# Patient Record
Sex: Female | Born: 1955
Health system: Southern US, Community
[De-identification: ages and names within clinical notes are randomized; demographics above are authoritative.]

## PROBLEM LIST (undated history)

## (undated) DIAGNOSIS — I10 Essential (primary) hypertension: Secondary | ICD-10-CM

## (undated) DIAGNOSIS — E785 Hyperlipidemia, unspecified: Secondary | ICD-10-CM

---

## 2013-06-02 ENCOUNTER — Ambulatory Visit (INDEPENDENT_AMBULATORY_CARE_PROVIDER_SITE_OTHER): Payer: PRIVATE HEALTH INSURANCE | Admitting: Family Medicine

## 2013-06-02 VITALS — BP 132/78 | HR 73 | Temp 98.3°F | Resp 16 | Ht 68.3 in | Wt 147.8 lb

## 2013-06-02 DIAGNOSIS — R21 Rash and other nonspecific skin eruption: Secondary | ICD-10-CM

## 2013-06-02 LAB — POCT CBC
Granulocyte percent: 70.6 %G (ref 37–80)
HCT, POC: 41.9 % (ref 37.7–47.9)
Hemoglobin: 13.3 g/dL (ref 12.2–16.2)
Lymph, poc: 2.5 (ref 0.6–3.4)
MCH, POC: 31.4 pg — AB (ref 27–31.2)
MCHC: 31.7 g/dL — AB (ref 31.8–35.4)
MCV: 99 fL — AB (ref 80–97)
MID (cbc): 0.8 (ref 0–0.9)
MPV: 8.7 fL (ref 0–99.8)
POC Granulocyte: 8 — AB (ref 2–6.9)
POC LYMPH PERCENT: 22.4 %L (ref 10–50)
POC MID %: 7 %M (ref 0–12)
Platelet Count, POC: 256 10*3/uL (ref 142–424)
RBC: 4.23 M/uL (ref 4.04–5.48)
RDW, POC: 13.2 %
WBC: 11.3 10*3/uL — AB (ref 4.6–10.2)

## 2013-06-02 MED ORDER — PREDNISONE 20 MG PO TABS: ORAL_TABLET | ORAL | Status: DC

## 2013-06-02 NOTE — Patient Instructions (Addendum)
Poison Ivy Poison ivy is a inflammation of the skin (contact dermatitis) caused by touching the allergens on the leaves of the ivy plant following previous exposure to the plant. The rash usually appears 48 hours after exposure. The rash is usually bumps (papules) or blisters (vesicles) in a linear pattern. Depending on your own sensitivity, the rash may simply cause redness and itching, or it may also progress to blisters which may break open. These must be well cared for to prevent secondary bacterial (germ) infection, followed by scarring. Keep any open areas dry, clean, dressed, and covered with an antibacterial ointment if needed. The eyes may also get puffy. The puffiness is worst in the morning and gets better as the day progresses. This dermatitis usually heals without scarring, within 2 to 3 weeks without treatment. HOME CARE INSTRUCTIONS  Thoroughly wash with soap and water as soon as you have been exposed to poison ivy. You have about one half hour to remove the plant resin before it will cause the rash. This washing will destroy the oil or antigen on the skin that is causing, or will cause, the rash. Be sure to wash under your fingernails as any plant resin there will continue to spread the rash. Do not rub skin vigorously when washing affected area. Poison ivy cannot spread if no oil from the plant remains on your body. A rash that has progressed to weeping sores will not spread the rash unless you have not washed thoroughly. It is also important to wash any clothes you have been wearing as these may carry active allergens. The rash will return if you wear the unwashed clothing, even several days later. Avoidance of the plant in the future is the best measure. Poison ivy plant can be recognized by the number of leaves. Generally, poison ivy has three leaves with flowering branches on a single stem. Diphenhydramine may be purchased over the counter and used as needed for itching. Do not drive with  this medication if it makes you drowsy.Ask your caregiver about medication for children. SEEK MEDICAL CARE IF:  Open sores develop.  Redness spreads beyond area of rash.  You notice purulent (pus-like) discharge.  You have increased pain.  Other signs of infection develop (such as fever). Document Released: 11/11/2000 Document Revised: 02/06/2012 Document Reviewed: 09/30/2009 ExitCare Patient Information 2014 [employer].  

## 2013-06-02 NOTE — Progress Notes (Signed)
57 yo woman from [place] who works as Psychologist, forensic.  She was recently treated with antibiotics for cellulitis on both arms, worse on right.  Also was given steroid for poison ivy in [place].  Objective:  NAD Diffuse erythematous blotches up to end of short sleeves both arms. Spares the palms.  No swelling or interdigital rash of hands. Results for orders placed in visit on 06/02/13  POCT CBC      Result Value Range   WBC 11.3 (*) 4.6 - 10.2 K/uL   Lymph, poc 2.5  0.6 - 3.4   POC LYMPH PERCENT 22.4  10 - 50 %L   MID (cbc) 0.8  0 - 0.9   POC MID % 7.0  0 - 12 %M   POC Granulocyte 8.0 (*) 2 - 6.9   Granulocyte percent 70.6  37 - 80 %G   RBC 4.23  4.04 - 5.48 M/uL   Hemoglobin 13.3  12.2 - 16.2 g/dL   HCT, POC 40.9  81.1 - 47.9 %   MCV 99.0 (*) 80 - 97 fL   MCH, POC 31.4 (*) 27 - 31.2 pg   MCHC 31.7 (*) 31.8 - 35.4 g/dL   RDW, POC 91.4     Platelet Count, POC 256  142 - 424 K/uL   MPV 8.7  0 - 99.8 fL     Assessment:  Residual poison ivy.  Suspect the mild WBC elevation is secondary to steroid induced demargination

## 2018-01-16 ENCOUNTER — Other Ambulatory Visit: Payer: Self-pay | Admitting: Internal Medicine

## 2018-01-16 DIAGNOSIS — Z1231 Encounter for screening mammogram for malignant neoplasm of breast: Secondary | ICD-10-CM

## 2018-02-16 ENCOUNTER — Ambulatory Visit
Admission: RE | Admit: 2018-02-16 | Discharge: 2018-02-16 | Disposition: A | Discharge status: Home / Self Care | Payer: No Typology Code available for payment source | Source: Ambulatory Visit | Attending: Internal Medicine | Admitting: Internal Medicine

## 2018-02-16 DIAGNOSIS — Z1231 Encounter for screening mammogram for malignant neoplasm of breast: Secondary | ICD-10-CM

## 2019-03-28 ENCOUNTER — Other Ambulatory Visit: Payer: Self-pay | Admitting: Family Medicine

## 2019-03-28 DIAGNOSIS — Z1231 Encounter for screening mammogram for malignant neoplasm of breast: Secondary | ICD-10-CM

## 2019-05-23 ENCOUNTER — Telehealth: Payer: Self-pay

## 2019-05-23 NOTE — Telephone Encounter (Signed)
NOTES ON FILE FROM DR Sabra Heck [phone], REFERRAL IN PROFICIENT

## 2019-06-17 ENCOUNTER — Ambulatory Visit
Admission: RE | Admit: 2019-06-17 | Discharge: 2019-06-17 | Disposition: A | Discharge status: Home / Self Care | Payer: No Typology Code available for payment source | Source: Ambulatory Visit | Attending: Family Medicine | Admitting: Family Medicine

## 2019-06-17 ENCOUNTER — Other Ambulatory Visit: Payer: Self-pay

## 2019-06-17 DIAGNOSIS — Z1231 Encounter for screening mammogram for malignant neoplasm of breast: Secondary | ICD-10-CM

## 2020-02-03 ENCOUNTER — Other Ambulatory Visit: Payer: Self-pay | Admitting: Family Medicine

## 2020-02-03 DIAGNOSIS — E2839 Other primary ovarian failure: Secondary | ICD-10-CM

## 2020-02-03 DIAGNOSIS — Z1231 Encounter for screening mammogram for malignant neoplasm of breast: Secondary | ICD-10-CM

## 2020-04-20 ENCOUNTER — Other Ambulatory Visit: Payer: Self-pay

## 2020-04-20 ENCOUNTER — Encounter (HOSPITAL_COMMUNITY): Payer: Self-pay

## 2020-04-20 ENCOUNTER — Emergency Department (HOSPITAL_COMMUNITY): Payer: Medicare Other

## 2020-04-20 ENCOUNTER — Inpatient Hospital Stay (HOSPITAL_COMMUNITY)
Admission: EM | Admit: 2020-04-20 | Discharge: 2020-04-22 | DRG: 281 | Disposition: A | Discharge status: Home / Self Care | Payer: Medicare Other | Attending: Cardiology | Admitting: Cardiology

## 2020-04-20 DIAGNOSIS — E7800 Pure hypercholesterolemia, unspecified: Secondary | ICD-10-CM | POA: Diagnosis not present

## 2020-04-20 DIAGNOSIS — Z803 Family history of malignant neoplasm of breast: Secondary | ICD-10-CM | POA: Diagnosis not present

## 2020-04-20 DIAGNOSIS — I10 Essential (primary) hypertension: Secondary | ICD-10-CM | POA: Diagnosis present

## 2020-04-20 DIAGNOSIS — E785 Hyperlipidemia, unspecified: Secondary | ICD-10-CM | POA: Diagnosis present

## 2020-04-20 DIAGNOSIS — I214 Non-ST elevation (NSTEMI) myocardial infarction: Secondary | ICD-10-CM | POA: Diagnosis present

## 2020-04-20 DIAGNOSIS — R079 Chest pain, unspecified: Secondary | ICD-10-CM | POA: Diagnosis present

## 2020-04-20 DIAGNOSIS — I249 Acute ischemic heart disease, unspecified: Secondary | ICD-10-CM

## 2020-04-20 DIAGNOSIS — I2510 Atherosclerotic heart disease of native coronary artery without angina pectoris: Secondary | ICD-10-CM | POA: Diagnosis present

## 2020-04-20 DIAGNOSIS — I161 Hypertensive emergency: Secondary | ICD-10-CM | POA: Diagnosis present

## 2020-04-20 DIAGNOSIS — Z8249 Family history of ischemic heart disease and other diseases of the circulatory system: Secondary | ICD-10-CM | POA: Diagnosis not present

## 2020-04-20 DIAGNOSIS — Z20822 Contact with and (suspected) exposure to covid-19: Secondary | ICD-10-CM | POA: Diagnosis present

## 2020-04-20 HISTORY — DX: Hyperlipidemia, unspecified: E78.5

## 2020-04-20 HISTORY — DX: Essential (primary) hypertension: I10

## 2020-04-20 LAB — CBC
HCT: 40.1 % (ref 36.0–46.0)
Hemoglobin: 13.7 g/dL (ref 12.0–15.0)
MCH: 32.7 pg (ref 26.0–34.0)
MCHC: 34.2 g/dL (ref 30.0–36.0)
MCV: 95.7 fL (ref 80.0–100.0)
Platelets: 259 10*3/uL (ref 150–400)
RBC: 4.19 MIL/uL (ref 3.87–5.11)
RDW: 12.9 % (ref 11.5–15.5)
WBC: 8.5 10*3/uL (ref 4.0–10.5)
nRBC: 0 % (ref 0.0–0.2)

## 2020-04-20 LAB — BASIC METABOLIC PANEL
Anion gap: 7 (ref 5–15)
BUN: 13 mg/dL (ref 8–23)
CO2: 30 mmol/L (ref 22–32)
Calcium: 9 mg/dL (ref 8.9–10.3)
Chloride: 99 mmol/L (ref 98–111)
Creatinine, Ser: 0.81 mg/dL (ref 0.44–1.00)
GFR calc Af Amer: >60 mL/min (ref >60)
GFR calc non Af Amer: >60 mL/min (ref >60)
Glucose, Bld: 107 mg/dL — ABNORMAL HIGH (ref 70–99)
Potassium: 3.9 mmol/L (ref 3.5–5.1)
Sodium: 136 mmol/L (ref 135–145)

## 2020-04-20 LAB — TROPONIN I (HIGH SENSITIVITY)
Troponin I (High Sensitivity): 1189 ng/L (ref <18)
Troponin I (High Sensitivity): 1735 ng/L (ref <18)

## 2020-04-20 MED ORDER — ASPIRIN 81 MG PO CHEW
324.0000 mg | CHEWABLE_TABLET | Freq: Once | ORAL | Status: AC
  Administered 2020-04-20: 324 mg via ORAL
  Filled 2020-04-20: qty 4

## 2020-04-20 MED ORDER — NITROGLYCERIN 0.4 MG SL SUBL
0.4000 mg | SUBLINGUAL_TABLET | SUBLINGUAL | Status: DC | PRN
  Administered 2020-04-20: 0.4 mg via SUBLINGUAL
  Filled 2020-04-20: qty 1

## 2020-04-20 NOTE — ED Notes (Signed)
ED Provider at bedside. 

## 2020-04-20 NOTE — ED Triage Notes (Signed)
Sternal chest pressure since 1400 today. Not relieved by anything. Checked BP at home 175/103.

## 2020-04-20 NOTE — ED Provider Notes (Signed)
[facility]-EMERGENCY DEPT Provider Note   CSN: [id number] Arrival date & time: 04/20/20  2049     History Chief Complaint  Patient presents with  . Chest Pain    Emily Villegas is a 64 y.o. female.  HPI       Constant chest pressure began 2PM Funny feeling in arms bilaterally and throat feels tight and sore Constant since that time No shortness of breath Nausea wave then improved No lightheadedness or sweating  No known medical problems, Dr. Hyacinth Meeker One of physicians prior to moving to [place] wanted to follow to BP but highest was 150s  No smoking, no other drugs, 2 glasses of wine/day Dad hx of CAD around 59  History reviewed. No pertinent past medical history.  Patient Active Problem List   Diagnosis Date Noted  . NSTEMI (non-ST elevated myocardial infarction) (HCC) 04/20/2020    History reviewed. No pertinent surgical history.   OB History   No obstetric history on file.     Family History  Problem Relation Age of Onset  . Breast cancer Niece 10    Social History   Tobacco Use  . Smoking status: Never Smoker  . Smokeless tobacco: Never Used  Substance Use Topics  . Alcohol use: Yes  . Drug use: No    Home Medications Prior to Admission medications   Medication Sig Start Date End Date Taking? Authorizing Provider  atorvastatin (LIPITOR) 10 MG tablet Take 10 mg by mouth daily. 02/05/20  Yes [provider]  cholecalciferol (VITAMIN D3) 25 MCG (1000 UNIT) tablet Take 1,000 Units by mouth daily.   Yes [provider]  predniSONE (DELTASONE) 20 MG tablet 2 daily with food Patient not taking: Reported on 04/20/2020 06/02/13   Elvina Sidle, MD    Allergies    Patient has no known allergies.  Review of Systems   Review of Systems  Constitutional: Negative for fever.  HENT: Negative for sore throat.   Eyes: Negative for visual disturbance.  Respiratory: Negative for cough and shortness of breath.     Cardiovascular: Positive for chest pain.  Gastrointestinal: Positive for nausea. Negative for abdominal pain and vomiting.  Genitourinary: Negative for difficulty urinating.  Musculoskeletal: Negative for back pain and neck pain.  Skin: Negative for rash.  Neurological: Negative for syncope, weakness, numbness (had tingling in bilateral UE) and headaches.    Physical Exam Updated Vital Signs BP (!) 147/91   Pulse 69   Temp 98.3 F (36.8 C) (Oral)   Resp 13   Ht 5\' 8"  (1.727 m)   Wt 69.4 kg   SpO2 100%   BMI 23.26 kg/m   Physical Exam Vitals and nursing note reviewed.  Constitutional:      General: She is not in acute distress.    Appearance: She is well-developed. She is not diaphoretic.  HENT:     Head: Normocephalic and atraumatic.  Eyes:     Conjunctiva/sclera: Conjunctivae normal.  Cardiovascular:     Rate and Rhythm: Normal rate and regular rhythm.     Heart sounds: Normal heart sounds. No murmur. No friction rub. No gallop.   Pulmonary:     Effort: Pulmonary effort is normal. No respiratory distress.     Breath sounds: Normal breath sounds. No wheezing or rales.  Abdominal:     General: There is no distension.     Palpations: Abdomen is soft.     Tenderness: There is no abdominal tenderness. There is no guarding.  Musculoskeletal:  General: No tenderness.     Cervical back: Normal range of motion.  Skin:    General: Skin is warm and dry.     Findings: No erythema or rash.  Neurological:     Mental Status: She is alert and oriented to person, place, and time.     Motor: No weakness.     Comments: Normal strength UE, normal sensation     ED Results / Procedures / Treatments   Labs (all labs ordered are listed, but only abnormal results are displayed) Labs Reviewed  BASIC METABOLIC PANEL - Abnormal; Notable for the following components:      Result Value   Glucose, Bld 107 (*)    All other components within normal limits  TROPONIN I (HIGH  SENSITIVITY) - Abnormal; Notable for the following components:   Troponin I (High Sensitivity) 1,189 (*)    All other components within normal limits  TROPONIN I (HIGH SENSITIVITY) - Abnormal; Notable for the following components:   Troponin I (High Sensitivity) 1,735 (*)    All other components within normal limits  SARS CORONAVIRUS 2 BY RT PCR (HOSPITAL ORDER, [facility])  CBC    EKG None  Radiology DG Chest 2 View  Result Date: 04/20/2020 CLINICAL DATA:  Sternal chest pressure. EXAM: CHEST - 2 VIEW COMPARISON:  May 16, 2019 FINDINGS: The lungs are hyperinflated. There is no evidence of acute infiltrate, pleural effusion or pneumothorax. The heart size and mediastinal contours are within normal limits. There is stable mild to moderate severity scoliosis of the thoracolumbar spine. IMPRESSION: No active cardiopulmonary disease. Electronically Signed   By: Virgina Norfolk M.D.   On: 04/20/2020 21:28    Procedures .Critical Care Performed by: Gareth Morgan, MD Authorized by: Gareth Morgan, MD   Critical care provider statement:    Critical care time (minutes):  30   Critical care was time spent personally by me on the following activities:  Discussions with consultants, evaluation of patient's response to treatment, examination of patient, ordering and performing treatments and interventions, ordering and review of laboratory studies, ordering and review of radiographic studies, pulse oximetry, re-evaluation of patient's condition, obtaining history from patient or surrogate and review of old charts   (including critical care time)  Medications Ordered in ED Medications  nitroGLYCERIN (NITROSTAT) SL tablet 0.4 mg (0.4 mg Sublingual Given 04/20/20 2216)  aspirin chewable tablet 324 mg (324 mg Oral Given 04/20/20 2216)    ED Course  I have reviewed the triage vital signs and the nursing notes.  Pertinent labs & imaging results that were available  during my care of the patient were reviewed by me and considered in my medical decision making (see chart for details).    MDM Rules/Calculators/A&P                      64 year old female with family history of coronary artery disease presents with concern for chest pressure beginning today, noted to have blood pressures of 201/106 on arrival to the emergency department.  EKG was evaluate me and showed no evidence of ST elevation MI.  No dyspnea, doubt PE. Low suspicion for aortic dissection given normal upper and lower extremity pulses, no radiation to back.  Troponin 1189.  Given aspirin and nitroglycerin with resolution of chest pain.  Repeat troponin pending. Cardiology consulted, Dr. Kalman Shan, and will admit to [facility]. Have not initiated heparin at this time in setting of significant htn on arrival,  possible demand ischemia.     Final Clinical Impression(s) / ED Diagnoses Final diagnoses:  ACS (acute coronary syndrome) [facility])  Hypertensive emergency    Rx / DC Orders ED Discharge Orders    None       Alvira Monday, MD 04/21/20 0021

## 2020-04-21 ENCOUNTER — Inpatient Hospital Stay (HOSPITAL_COMMUNITY): Payer: Medicare Other

## 2020-04-21 ENCOUNTER — Encounter (HOSPITAL_COMMUNITY)
Admission: EM | Disposition: A | Discharge status: Home / Self Care | Payer: Self-pay | Source: Home / Self Care | Attending: Cardiology

## 2020-04-21 ENCOUNTER — Encounter (HOSPITAL_COMMUNITY): Payer: Self-pay | Admitting: Cardiology

## 2020-04-21 ENCOUNTER — Other Ambulatory Visit: Payer: Self-pay

## 2020-04-21 DIAGNOSIS — I10 Essential (primary) hypertension: Secondary | ICD-10-CM | POA: Diagnosis present

## 2020-04-21 DIAGNOSIS — R079 Chest pain, unspecified: Secondary | ICD-10-CM

## 2020-04-21 DIAGNOSIS — I214 Non-ST elevation (NSTEMI) myocardial infarction: Principal | ICD-10-CM

## 2020-04-21 DIAGNOSIS — I2510 Atherosclerotic heart disease of native coronary artery without angina pectoris: Secondary | ICD-10-CM

## 2020-04-21 HISTORY — PX: LEFT HEART CATH AND CORONARY ANGIOGRAPHY: CATH118249

## 2020-04-21 LAB — CBC
HCT: 36.8 % (ref 36.0–46.0)
Hemoglobin: 12.4 g/dL (ref 12.0–15.0)
MCH: 31.8 pg (ref 26.0–34.0)
MCHC: 33.7 g/dL (ref 30.0–36.0)
MCV: 94.4 fL (ref 80.0–100.0)
Platelets: 241 10*3/uL (ref 150–400)
RBC: 3.9 MIL/uL (ref 3.87–5.11)
RDW: 12.8 % (ref 11.5–15.5)
WBC: 8.7 10*3/uL (ref 4.0–10.5)
nRBC: 0 % (ref 0.0–0.2)

## 2020-04-21 LAB — ECHOCARDIOGRAM COMPLETE
Height: 68 in
Weight: 2447.99 oz

## 2020-04-21 LAB — TSH: TSH: 2.672 u[IU]/mL (ref 0.350–4.500)

## 2020-04-21 LAB — HEMOGLOBIN A1C
Hgb A1c MFr Bld: 5.6 % (ref 4.8–5.6)
Mean Plasma Glucose: 114.02 mg/dL

## 2020-04-21 LAB — TROPONIN I (HIGH SENSITIVITY)
Troponin I (High Sensitivity): 4359 ng/L (ref <18)
Troponin I (High Sensitivity): 4256 ng/L (ref <18)

## 2020-04-21 LAB — LIPID PANEL
Cholesterol: 184 mg/dL (ref 0–200)
HDL: 69 mg/dL (ref >40)
LDL Cholesterol: 106 mg/dL — ABNORMAL HIGH (ref 0–99)
Total CHOL/HDL Ratio: 2.7 RATIO
Triglycerides: 46 mg/dL (ref <150)
VLDL: 9 mg/dL (ref 0–40)

## 2020-04-21 LAB — SARS CORONAVIRUS 2 BY RT PCR (HOSPITAL ORDER, PERFORMED IN ~~LOC~~ HOSPITAL LAB): SARS Coronavirus 2: NEGATIVE

## 2020-04-21 SURGERY — LEFT HEART CATH AND CORONARY ANGIOGRAPHY
Anesthesia: LOCAL

## 2020-04-21 MED ORDER — ASPIRIN 81 MG PO CHEW
81.0000 mg | CHEWABLE_TABLET | ORAL | Status: AC
  Administered 2020-04-21: 81 mg via ORAL

## 2020-04-21 MED ORDER — ONDANSETRON HCL 4 MG/2ML IJ SOLN: 4.0000 mg | Freq: Four times a day (QID) | INTRAMUSCULAR | Status: DC | PRN

## 2020-04-21 MED ORDER — SODIUM CHLORIDE 0.9% FLUSH: 3.0000 mL | INTRAVENOUS | Status: DC | PRN

## 2020-04-21 MED ORDER — VERAPAMIL HCL 2.5 MG/ML IV SOLN
INTRAVENOUS | Status: AC
  Filled 2020-04-21: qty 2

## 2020-04-21 MED ORDER — HEPARIN (PORCINE) IN NACL 1000-0.9 UT/500ML-% IV SOLN
INTRAVENOUS | Status: DC | PRN
  Administered 2020-04-21: 500 mL

## 2020-04-21 MED ORDER — SODIUM CHLORIDE 0.9 % WEIGHT BASED INFUSION
3.0000 mL/kg/h | INTRAVENOUS | Status: AC
  Administered 2020-04-21: 3 mL/kg/h via INTRAVENOUS

## 2020-04-21 MED ORDER — HEPARIN (PORCINE) IN NACL 1000-0.9 UT/500ML-% IV SOLN
INTRAVENOUS | Status: AC
  Filled 2020-04-21: qty 1000

## 2020-04-21 MED ORDER — HEPARIN BOLUS VIA INFUSION
4000.0000 [IU] | Freq: Once | INTRAVENOUS | Status: AC
  Administered 2020-04-21: 4000 [IU] via INTRAVENOUS
  Filled 2020-04-21: qty 4000

## 2020-04-21 MED ORDER — SODIUM CHLORIDE 0.9 % IV SOLN: 250.0000 mL | INTRAVENOUS | Status: DC | PRN

## 2020-04-21 MED ORDER — LOSARTAN POTASSIUM 50 MG PO TABS
50.0000 mg | ORAL_TABLET | Freq: Every day | ORAL | Status: DC
  Administered 2020-04-21 – 2020-04-22 (×2): 50 mg via ORAL
  Filled 2020-04-21 (×2): qty 1

## 2020-04-21 MED ORDER — FENTANYL CITRATE (PF) 100 MCG/2ML IJ SOLN
INTRAMUSCULAR | Status: AC
  Filled 2020-04-21: qty 2

## 2020-04-21 MED ORDER — SODIUM CHLORIDE 0.9 % WEIGHT BASED INFUSION: 1.0000 mL/kg/h | INTRAVENOUS | Status: DC

## 2020-04-21 MED ORDER — LIDOCAINE HCL (PF) 1 % IJ SOLN
INTRAMUSCULAR | Status: DC | PRN
  Administered 2020-04-21: 2 mL

## 2020-04-21 MED ORDER — NITROGLYCERIN 0.4 MG SL SUBL: 0.4000 mg | SUBLINGUAL_TABLET | SUBLINGUAL | Status: DC | PRN

## 2020-04-21 MED ORDER — SODIUM CHLORIDE 0.9% FLUSH
3.0000 mL | Freq: Two times a day (BID) | INTRAVENOUS | Status: DC
  Administered 2020-04-22: 3 mL via INTRAVENOUS

## 2020-04-21 MED ORDER — ATORVASTATIN CALCIUM 80 MG PO TABS
80.0000 mg | ORAL_TABLET | Freq: Every day | ORAL | Status: DC
  Administered 2020-04-21 (×2): 80 mg via ORAL
  Filled 2020-04-21 (×2): qty 1

## 2020-04-21 MED ORDER — ISOSORB DINITRATE-HYDRALAZINE 20-37.5 MG PO TABS
1.0000 | ORAL_TABLET | Freq: Two times a day (BID) | ORAL | Status: DC
  Administered 2020-04-21: 1 via ORAL
  Filled 2020-04-21: qty 1

## 2020-04-21 MED ORDER — MIDAZOLAM HCL 2 MG/2ML IJ SOLN
INTRAMUSCULAR | Status: DC | PRN
  Administered 2020-04-21: 1 mg via INTRAVENOUS

## 2020-04-21 MED ORDER — SODIUM CHLORIDE 0.9 % WEIGHT BASED INFUSION
1.0000 mL/kg/h | INTRAVENOUS | Status: AC
  Administered 2020-04-21: 1 mL/kg/h via INTRAVENOUS

## 2020-04-21 MED ORDER — MIDAZOLAM HCL 2 MG/2ML IJ SOLN
INTRAMUSCULAR | Status: AC
  Filled 2020-04-21: qty 2

## 2020-04-21 MED ORDER — CARVEDILOL 12.5 MG PO TABS
12.5000 mg | ORAL_TABLET | Freq: Two times a day (BID) | ORAL | Status: DC
  Administered 2020-04-21 – 2020-04-22 (×3): 12.5 mg via ORAL
  Filled 2020-04-21 (×3): qty 1

## 2020-04-21 MED ORDER — FENTANYL CITRATE (PF) 100 MCG/2ML IJ SOLN
INTRAMUSCULAR | Status: DC | PRN
  Administered 2020-04-21: 25 ug via INTRAVENOUS

## 2020-04-21 MED ORDER — HEPARIN SODIUM (PORCINE) 1000 UNIT/ML IJ SOLN
INTRAMUSCULAR | Status: DC | PRN
  Administered 2020-04-21: 4000 [IU] via INTRAVENOUS

## 2020-04-21 MED ORDER — SODIUM CHLORIDE 0.9% FLUSH: 3.0000 mL | Freq: Two times a day (BID) | INTRAVENOUS | Status: DC

## 2020-04-21 MED ORDER — VERAPAMIL HCL 2.5 MG/ML IV SOLN
INTRAVENOUS | Status: DC | PRN
  Administered 2020-04-21: 10 mL via INTRA_ARTERIAL

## 2020-04-21 MED ORDER — LABETALOL HCL 5 MG/ML IV SOLN: 10.0000 mg | INTRAVENOUS | Status: AC | PRN

## 2020-04-21 MED ORDER — LABETALOL HCL 100 MG PO TABS
100.0000 mg | ORAL_TABLET | Freq: Two times a day (BID) | ORAL | Status: DC
  Administered 2020-04-21: 100 mg via ORAL
  Filled 2020-04-21: qty 1

## 2020-04-21 MED ORDER — HEPARIN (PORCINE) 25000 UT/250ML-% IV SOLN
800.0000 [IU]/h | INTRAVENOUS | Status: DC
  Administered 2020-04-21: 800 [IU]/h via INTRAVENOUS
  Filled 2020-04-21: qty 250

## 2020-04-21 MED ORDER — LIDOCAINE HCL (PF) 1 % IJ SOLN
INTRAMUSCULAR | Status: AC
  Filled 2020-04-21: qty 30

## 2020-04-21 MED ORDER — ASPIRIN 81 MG PO CHEW
81.0000 mg | CHEWABLE_TABLET | Freq: Every day | ORAL | Status: DC
  Administered 2020-04-22: 81 mg via ORAL
  Filled 2020-04-21 (×2): qty 1

## 2020-04-21 MED ORDER — ACETAMINOPHEN 325 MG PO TABS: 650.0000 mg | ORAL_TABLET | ORAL | Status: DC | PRN

## 2020-04-21 MED ORDER — IOHEXOL 350 MG/ML SOLN
INTRAVENOUS | Status: DC | PRN
  Administered 2020-04-21: 50 mL

## 2020-04-21 MED ORDER — HYDRALAZINE HCL 20 MG/ML IJ SOLN: 10.0000 mg | INTRAMUSCULAR | Status: AC | PRN

## 2020-04-21 MED ORDER — HEPARIN SODIUM (PORCINE) 1000 UNIT/ML IJ SOLN
INTRAMUSCULAR | Status: AC
  Filled 2020-04-21: qty 1

## 2020-04-21 SURGICAL SUPPLY — 10 items
CATH 5FR JL3.5 JR4 ANG PIG MP (CATHETERS) ×1 IMPLANT
DEVICE RAD TR BAND REGULAR (VASCULAR PRODUCTS) ×1 IMPLANT
GLIDESHEATH SLEND SS 6F .021 (SHEATH) ×1 IMPLANT
GUIDEWIRE INQWIRE 1.5J.035X260 (WIRE) IMPLANT
INQWIRE 1.5J .035X260CM (WIRE) ×2
KIT HEART LEFT (KITS) ×2 IMPLANT
PACK CARDIAC CATHETERIZATION (CUSTOM PROCEDURE TRAY) ×2 IMPLANT
SHEATH PROBE COVER 6X72 (BAG) ×1 IMPLANT
TRANSDUCER W/STOPCOCK (MISCELLANEOUS) ×2 IMPLANT
TUBING CIL FLEX 10 FLL-RA (TUBING) ×2 IMPLANT

## 2020-04-21 NOTE — Progress Notes (Signed)
Responded to Spiritual care consult to assist with AD.Patient given AD and Chaplain will be page if needed.

## 2020-04-21 NOTE — Plan of Care (Signed)
  Problem: Education: Goal: Knowledge of General Education information will improve Description: Including pain rating scale, medication(s)/side effects and non-pharmacologic comfort measures Outcome: Progressing   Problem: Health Behavior/Discharge Planning: Goal: Ability to manage health-related needs will improve Outcome: Progressing   Problem: Clinical Measurements: Goal: Diagnostic test results will improve Outcome: Progressing Goal: Respiratory complications will improve Outcome: Progressing   Problem: Nutrition: Goal: Adequate nutrition will be maintained Outcome: Progressing   Problem: Coping: Goal: Level of anxiety will decrease Outcome: Progressing   Problem: Elimination: Goal: Will not experience complications related to bowel motility Outcome: Progressing Goal: Will not experience complications related to urinary retention Outcome: Progressing   Problem: Pain Managment: Goal: General experience of comfort will improve Outcome: Progressing   Problem: Safety: Goal: Ability to remain free from injury will improve Outcome: Progressing

## 2020-04-21 NOTE — Progress Notes (Signed)
ANTICOAGULATION CONSULT NOTE - Initial Consult  Pharmacy Consult for heparin Indication: chest pain/ACS  No Known Allergies  Patient Measurements: Height: 5\' 8"  (172.7 cm) Weight: 69.4 kg (153 lb) IBW/kg (Calculated) : 63.9 Heparin Dosing Weight: 69.4 kg  Vital Signs: Temp: 98.3 F (36.8 C) (05/25 0148) Temp Source: Oral (05/25 0148) BP: 179/96 (05/25 0148) Pulse Rate: 61 (05/25 0148)  Labs: Recent Labs    04/20/20 2114 04/20/20 2310  HGB 13.7  --   HCT 40.1  --   PLT 259  --   CREATININE 0.81  --   TROPONINIHS 1,189* 1,735*    Estimated Creatinine Clearance: 69.8 mL/min (by C-G formula based on SCr of 0.81 mg/dL).   Medical History: History reviewed. No pertinent past medical history.  Medications:  Scheduled:  . aspirin  81 mg Oral Daily  . atorvastatin  80 mg Oral Daily  . isosorbide-hydrALAZINE  1 tablet Oral BID  . labetalol  100 mg Oral BID    Assessment: 64 yof presenting with chest pain - transferred from [facility] to [facility]. No AC PTA.   Hgb 13.7, plt 253. Trop 1735. No documented s/sx of bleeding.   Goal of Therapy:  Heparin level 0.3-0.7 units/ml Monitor platelets by anticoagulation protocol: Yes   Plan:  Give 4000 units bolus x 1 Start heparin infusion at 800 units/hr Check anti-Xa level in 6 hours and daily while on heparin Continue to monitor H&H and platelets  [facility], PharmD, BCCCP Clinical Pharmacist  04/21/2020 2:21 AM  Please check AMION for all [facility] Pharmacy phone numbers After 10:00 PM, call Main Pharmacy [phone]

## 2020-04-21 NOTE — ED Notes (Signed)
[facility] dispatch notified for need of transport.  

## 2020-04-21 NOTE — Progress Notes (Signed)
  Echocardiogram 2D Echocardiogram has been performed.  Gerda Diss 04/21/2020, 12:20 PM

## 2020-04-21 NOTE — Progress Notes (Signed)
Progress Note  Patient Name: Emily Villegas Date of Encounter: 04/21/2020  Primary Cardiologist: No primary care provider on file. New  Subjective   No chest pain this am  Inpatient Medications    Scheduled Meds: . aspirin  81 mg Oral Daily  . aspirin  81 mg Oral Pre-Cath  . atorvastatin  80 mg Oral q1800  . carvedilol  12.5 mg Oral BID WC  . losartan  50 mg Oral Daily   Continuous Infusions: . sodium chloride     Followed by  . sodium chloride    . heparin 800 Units/hr (04/21/20 0315)   PRN Meds: acetaminophen, nitroGLYCERIN, ondansetron (ZOFRAN) IV   Vital Signs    Vitals:   04/21/20 0000 04/21/20 0100 04/21/20 0148 04/21/20 0549  BP: (!) 166/88 (!) 166/90 (!) 179/96 108/68  Pulse: 63 61 61 (!) 58  Resp: 14 16  16   Temp:   98.3 F (36.8 C) 97.6 F (36.4 C)  TempSrc:   Oral Oral  SpO2: 98% 99% 100% 97%  Weight:      Height:        Intake/Output Summary (Last 24 hours) at 04/21/2020 0742 Last data filed at 04/21/2020 0550 Gross per 24 hour  Intake 40.12 ml  Output 200 ml  Net -159.88 ml   Last 3 Weights 04/20/2020 06/02/2013  Weight (lbs) 153 lb 147 lb 12.8 oz  Weight (kg) 69.4 kg 67.042 kg      Telemetry    NSR one PVC couplet, one 4 beat run- Personally Reviewed  ECG    NSR with T wave inversion inferiorly - Personally Reviewed  Physical Exam   GEN: No acute distress.   Neck: No JVD Cardiac: RRR, no murmurs, rubs, or gallops.  Respiratory: Clear to auscultation bilaterally. GI: Soft, nontender, non-distended  MS: No edema; No deformity. Neuro:  Nonfocal  Psych: Normal affect   Labs    High Sensitivity Troponin:   Recent Labs  Lab 04/20/20 2114 04/20/20 2310 04/21/20 0259  TROPONINIHS 1,189* 1,735* 4,359*      Chemistry Recent Labs  Lab 04/20/20 2114  NA 136  K 3.9  CL 99  CO2 30  GLUCOSE 107*  BUN 13  CREATININE 0.81  CALCIUM 9.0  GFRNONAA >60  GFRAA >60  ANIONGAP 7     Hematology Recent Labs  Lab  04/20/20 2114 04/21/20 0259  WBC 8.5 8.7  RBC 4.19 3.90  HGB 13.7 12.4  HCT 40.1 36.8  MCV 95.7 94.4  MCH 32.7 31.8  MCHC 34.2 33.7  RDW 12.9 12.8  PLT 259 241    BNPNo results for input(s): BNP, PROBNP in the last 168 hours.   DDimer No results for input(s): DDIMER in the last 168 hours.   Radiology    DG Chest 2 View  Result Date: 04/20/2020 CLINICAL DATA:  Sternal chest pressure. EXAM: CHEST - 2 VIEW COMPARISON:  May 16, 2019 FINDINGS: The lungs are hyperinflated. There is no evidence of acute infiltrate, pleural effusion or pneumothorax. The heart size and mediastinal contours are within normal limits. There is stable mild to moderate severity scoliosis of the thoracolumbar spine. IMPRESSION: No active cardiopulmonary disease. Electronically Signed   By: May 18, 2019 M.D.   On: 04/20/2020 21:28    Cardiac Studies   none  Patient Profile     64 y.o. female with history of untreated HTN and HLD presents with NSTEMI  Assessment & Plan    NSTEMI: presents with typical chest pain that  improved with nitro and rising troponin, now up to 4359.  - Plan invasive evaluation today with cardiac cath +/- PCI - initiate statin therapy - Echo today - On heparin ggt - Increase atorvastatin to 80mg daily - Start beta blocker and ARB for HTN -The procedure and risks were reviewed including but not limited to death, myocardial infarction, stroke, arrythmias, bleeding, transfusion, emergency surgery, dye allergy, or renal dysfunction. The patient voices understanding and is agreeable to proceed.   Uncontrolled hypertension: reports prior history but did not want to go on medication. BP improved this am - switch labetalol to coreg.  - Start ARB - Titrate up as needed for goal 130/80  Hyperlipidemia. LDL 109. Goal <70. On higher dose lipitor   For questions or updates, please contact [facility] Please consult [email or web address] for contact info under        Signed, Osie Amparo, MD  04/21/2020, 7:42 AM    

## 2020-04-21 NOTE — Procedures (Signed)
Echo attempted. Patient just beginning meal. Will attempt again later.

## 2020-04-21 NOTE — Interval H&P Note (Signed)
Cath Lab Visit (complete for each Cath Lab visit)  Clinical Evaluation Leading to the Procedure:   ACS: Yes.    Non-ACS:    Anginal Classification: CCS IV  Anti-ischemic medical therapy: Minimal Therapy (1 class of medications)  Non-Invasive Test Results: No non-invasive testing performed  Prior CABG: No previous CABG      History and Physical Interval Note:  04/21/2020 8:31 AM  Emily Villegas  has presented today for surgery, with the diagnosis of nstemi.  The various methods of treatment have been discussed with the patient and family. After consideration of risks, benefits and other options for treatment, the patient has consented to  Procedure(s): LEFT HEART CATH AND CORONARY ANGIOGRAPHY (N/A) as a surgical intervention.  The patient's history has been reviewed, patient examined, no change in status, stable for surgery.  I have reviewed the patient's chart and labs.  Questions were answered to the patient's satisfaction.     Tonny Bollman

## 2020-04-21 NOTE — H&P (Signed)
Cardiology History & Physical    Patient ID: Yuka Lallier MRN: [id number], DOB/AGE: 02/16/1956   Admit date: 04/20/2020  Primary Physician: Dr. Hyacinth Meeker Primary Cardiologist: none  Patient Profile    Emily Villegas is a 64 y.o. woman with a history of untreated hypertension and hyperlipidemia who presents with chest pain.  History of Present Illness    Emily Villegas reports that she has been doing well until she awoke in the afternoon around 2 pm with chest discomfort that felt like somebody stepping on her chest. This was constant and associated with constant aching and paresthesias in both arms and tightness in her throat. No pleuritic or positional component, no associated dyspnea. She went to [employer] after waking and did not notice any worsening of her symptoms, though they did not improve. When she returned from the store she had a wave of nausea without vomiting. She checked her blood pressure and noted it to be 175/103 at home. She had her husband drive her to [facility] ED where blood pressure was 184/99 and remained elevated (lowest was 166/88). CXR was normal. Initial troponin was 1189, then 1735 on 2-hour repeat. ECG showed a minor inferior T-wave abnormality, but otherwise no significant ischemic changes (poor quality tracing). She was given full-dose aspirin and 3 nitroglycerin, after which her chest pain resolved. She was transferred here for further management. She has not had any further chest pain since it resolved in the ED.  She does not check her BP at home and does not know her baseline. Anti-hypertensive medication was recommended in the past but she didn't want to take it at the time. She reports stress testing around 10 years ago but can't recall any details. She states that she did have some episodes of chest heaviness about a year ago and her PCP wanted her to see cardiology which she declined. She says some testing was done but again cannot recall any details  (ECG, but does not sound like stress testing or echo was performed). These episodes resolved without any recurrence until now. She does not think her blood pressure was elevated at the time.  Past Medical History   Past Medical History:  Diagnosis Date  . Hyperlipidemia   . Hypertension     History reviewed. No pertinent surgical history.   Allergies No Known Allergies  Home Medications    Prior to Admission medications   Medication Sig Start Date End Date Taking? Authorizing Provider  atorvastatin (LIPITOR) 10 MG tablet Take 10 mg by mouth daily. 02/05/20  Yes [provider]  cholecalciferol (VITAMIN D3) 25 MCG (1000 UNIT) tablet Take 1,000 Units by mouth daily.   Yes [provider]  predniSONE (DELTASONE) 20 MG tablet 2 daily with food Patient not taking: Reported on 04/20/2020 06/02/13   Elvina Sidle, MD    Family History    Family History  Problem Relation Age of Onset  . Heart attack Father Late 28s  . Breast cancer Niece 19   She indicated that her mother is alive. She indicated that her father is deceased. She indicated that her sister is alive. She indicated that her brother is alive. She indicated that her maternal grandmother is deceased. She indicated that her maternal grandfather is deceased. She indicated that her paternal grandmother is deceased. She indicated that her paternal grandfather is deceased. She indicated that her daughter is alive. She indicated that her son is alive. She indicated that the status of her niece is unknown.  Social History    Socioeconomic History  . Marital status: Married  Tobacco Use  . Smoking status: Never Smoker  . Smokeless tobacco: Never Used  Substance and Sexual Activity  . Alcohol use: Yes, 1-2 drinks (wine/cocktail) most days of the week  . Drug use: No  . Sexual activity: Not on file    Review of Systems    General:  No chills, fever, night sweats or weight changes.  Cardiovascular:  See  HPI Dermatological: No rash, lesions/masses Respiratory: No cough, dyspnea Urologic: No hematuria, dysuria Abdominal:   No nausea, vomiting, diarrhea, bright red blood per rectum, melena, or hematemesis Neurologic:  No visual changes, weakness, changes in mental status. All other systems reviewed and are otherwise negative except as noted above.  Physical Exam    BP (!) 179/96 (BP Location: Right Arm)   Pulse 61   Temp 98.3 F (36.8 C) (Oral)   Resp 16   Ht 5\' 8"  (1.727 m)   Wt 69.4 kg   SpO2 100%   BMI 23.26 kg/m  General: Alert, NAD HEENT: Normal  Neck: No bruits or JVD. Lungs:  Resp regular and unlabored, CTA bilaterally. Heart: Regular rhythm, no s3, s4, or murmurs. Abdomen: Soft, non-tender, non-distended, BS +.  Extremities: Warm. No clubbing, cyanosis or edema. DP/PT/Radials 2+ and equal bilaterally. Psych: Normal affect. Neuro: Alert and oriented. No gross focal deficits. No abnormal movements.  Labs    Lab Results  Component Value Date   NA 136 04/20/2020   K 3.9 04/20/2020   CL 99 04/20/2020   CREATININE 0.81 04/20/2020   GFRNONAA >60 04/20/2020   GFRAA >60 04/20/2020   BUN 13 04/20/2020   CO2 30 04/20/2020   CALCIUM 9.0 04/20/2020   HGB 13.7 04/20/2020   PLT 259 04/20/2020   Recent Labs    04/20/20 2114 04/20/20 2310  TROPONINIHS 1,189* 1,735*    Radiology Studies    DG Chest 2 View  Result Date: 04/20/2020 CLINICAL DATA:  Sternal chest pressure. EXAM: CHEST - 2 VIEW COMPARISON:  May 16, 2019 FINDINGS: The lungs are hyperinflated. There is no evidence of acute infiltrate, pleural effusion or pneumothorax. The heart size and mediastinal contours are within normal limits. There is stable mild to moderate severity scoliosis of the thoracolumbar spine. IMPRESSION: No active cardiopulmonary disease. Electronically Signed   By: Virgina Norfolk M.D.   On: 04/20/2020 21:28    ECG & Cardiac Imaging    ECG: NSR, possible LAE, inferior T-wave  abnormality - personally reviewed.  Assessment & Plan    NSTEMI: presents with fairly typical chest pain that improved with nitro and rising troponin, now up to 1735. Although she is severely hypertensive, her chest pain has not correlated with her blood pressure and there seems to be more chronicity to the uncontrolled hypertension. Fortunately she is currently pain-free with no evidence of heart failure. Risk factors including family history, age, hypertension, hyperlipidemia. The patient's TIMI risk score is 3, which indicates a 13% risk of all cause mortality, new or recurrent myocardial infarction or need for urgent revascularization in the next 14 days.  - Early invasive strategy with coronary angiography in the morning  - Right radial access okay  - No contraindications to DES  - Keep NPO - Check lipids and A1c for further risk stratification - TTE in the morning - Monitor telemetry, serial troponins, ECGs - Start heparin ggt - Increase atorvastatin to 80mg  daily - Start beta blocker and Bidil as outlined below -  Transition from Bidil to ACEi/ARB prior to discharge - Counseled on importance of medication adherence and healthy lifestyle  Uncontrolled hypertension: reports prior history but did not want to go on medication. Remains elevated. - Start labetalol 100mg  BID - Start Bidil 20-37.5mg  BID - Titrate up as needed for goal 130/80 - Transition Bidil to ACEi/ARB prior to discharge  Nutrition: NPO DVT ppx: therapeutic UFH Advanced Care Planning: Full Code Dispo: Admit to inpatient for urgent left heart cath  Signed, , MD 04/21/2020, 3:02 AM

## 2020-04-21 NOTE — H&P (View-Only) (Signed)
Progress Note  Patient Name: Emily Villegas Date of Encounter: 04/21/2020  Primary Cardiologist: No primary care provider on file. New  Subjective   No chest pain this am  Inpatient Medications    Scheduled Meds: . aspirin  81 mg Oral Daily  . aspirin  81 mg Oral Pre-Cath  . atorvastatin  80 mg Oral q1800  . carvedilol  12.5 mg Oral BID WC  . losartan  50 mg Oral Daily   Continuous Infusions: . sodium chloride     Followed by  . sodium chloride    . heparin 800 Units/hr (04/21/20 0315)   PRN Meds: acetaminophen, nitroGLYCERIN, ondansetron (ZOFRAN) IV   Vital Signs    Vitals:   04/21/20 0000 04/21/20 0100 04/21/20 0148 04/21/20 0549  BP: (!) 166/88 (!) 166/90 (!) 179/96 108/68  Pulse: 63 61 61 (!) 58  Resp: 14 16  16   Temp:   98.3 F (36.8 C) 97.6 F (36.4 C)  TempSrc:   Oral Oral  SpO2: 98% 99% 100% 97%  Weight:      Height:        Intake/Output Summary (Last 24 hours) at 04/21/2020 0742 Last data filed at 04/21/2020 0550 Gross per 24 hour  Intake 40.12 ml  Output 200 ml  Net -159.88 ml   Last 3 Weights 04/20/2020 06/02/2013  Weight (lbs) 153 lb 147 lb 12.8 oz  Weight (kg) 69.4 kg 67.042 kg      Telemetry    NSR one PVC couplet, one 4 beat run- Personally Reviewed  ECG    NSR with T wave inversion inferiorly - Personally Reviewed  Physical Exam   GEN: No acute distress.   Neck: No JVD Cardiac: RRR, no murmurs, rubs, or gallops.  Respiratory: Clear to auscultation bilaterally. GI: Soft, nontender, non-distended  MS: No edema; No deformity. Neuro:  Nonfocal  Psych: Normal affect   Labs    High Sensitivity Troponin:   Recent Labs  Lab 04/20/20 2114 04/20/20 2310 04/21/20 0259  TROPONINIHS 1,189* 1,735* 4,359*      Chemistry Recent Labs  Lab 04/20/20 2114  NA 136  K 3.9  CL 99  CO2 30  GLUCOSE 107*  BUN 13  CREATININE 0.81  CALCIUM 9.0  GFRNONAA >60  GFRAA >60  ANIONGAP 7     Hematology Recent Labs  Lab  04/20/20 2114 04/21/20 0259  WBC 8.5 8.7  RBC 4.19 3.90  HGB 13.7 12.4  HCT 40.1 36.8  MCV 95.7 94.4  MCH 32.7 31.8  MCHC 34.2 33.7  RDW 12.9 12.8  PLT 259 241    BNPNo results for input(s): BNP, PROBNP in the last 168 hours.   DDimer No results for input(s): DDIMER in the last 168 hours.   Radiology    DG Chest 2 View  Result Date: 04/20/2020 CLINICAL DATA:  Sternal chest pressure. EXAM: CHEST - 2 VIEW COMPARISON:  May 16, 2019 FINDINGS: The lungs are hyperinflated. There is no evidence of acute infiltrate, pleural effusion or pneumothorax. The heart size and mediastinal contours are within normal limits. There is stable mild to moderate severity scoliosis of the thoracolumbar spine. IMPRESSION: No active cardiopulmonary disease. Electronically Signed   By: May 18, 2019 M.D.   On: 04/20/2020 21:28    Cardiac Studies   none  Patient Profile     64 y.o. female with history of untreated HTN and HLD presents with NSTEMI  Assessment & Plan    NSTEMI: presents with typical chest pain that  improved with nitro and rising troponin, now up to 4359.  - Plan invasive evaluation today with cardiac cath +/- PCI - initiate statin therapy - Echo today - On heparin ggt - Increase atorvastatin to 80mg  daily - Start beta blocker and ARB for HTN -The procedure and risks were reviewed including but not limited to death, myocardial infarction, stroke, arrythmias, bleeding, transfusion, emergency surgery, dye allergy, or renal dysfunction. The patient voices understanding and is agreeable to proceed.   Uncontrolled hypertension: reports prior history but did not want to go on medication. BP improved this am - switch labetalol to coreg.  - Start ARB - Titrate up as needed for goal 130/80  Hyperlipidemia. LDL 109. Goal <70. On higher dose lipitor   For questions or updates, please contact [facility] Please consult [email or web address] for contact info under        Signed, Arcenio Mullaly  [place], MD  04/21/2020, 7:42 AM

## 2020-04-22 ENCOUNTER — Telehealth: Payer: Self-pay | Admitting: Medical

## 2020-04-22 DIAGNOSIS — I10 Essential (primary) hypertension: Secondary | ICD-10-CM

## 2020-04-22 DIAGNOSIS — E7800 Pure hypercholesterolemia, unspecified: Secondary | ICD-10-CM

## 2020-04-22 LAB — HEPATIC FUNCTION PANEL
ALT: 16 U/L (ref 0–44)
AST: 24 U/L (ref 15–41)
Albumin: 3.7 g/dL (ref 3.5–5.0)
Alkaline Phosphatase: 69 U/L (ref 38–126)
Bilirubin, Direct: <0.1 mg/dL (ref 0.0–0.2)
Total Bilirubin: 0.9 mg/dL (ref 0.3–1.2)
Total Protein: 6.7 g/dL (ref 6.5–8.1)

## 2020-04-22 MED ORDER — ATORVASTATIN CALCIUM 80 MG PO TABS: 80.0000 mg | ORAL_TABLET | Freq: Every day | ORAL | 11 refills | Status: DC

## 2020-04-22 MED ORDER — LOSARTAN POTASSIUM 50 MG PO TABS: 50.0000 mg | ORAL_TABLET | Freq: Every day | ORAL | 11 refills | Status: DC

## 2020-04-22 MED ORDER — CARVEDILOL 12.5 MG PO TABS: 12.5000 mg | ORAL_TABLET | Freq: Two times a day (BID) | ORAL | 11 refills | Status: DC

## 2020-04-22 MED ORDER — NITROGLYCERIN 0.4 MG SL SUBL: 0.4000 mg | SUBLINGUAL_TABLET | SUBLINGUAL | 1 refills | Status: DC | PRN

## 2020-04-22 MED ORDER — ASPIRIN 81 MG PO CHEW: 81.0000 mg | CHEWABLE_TABLET | Freq: Every day | ORAL | 11 refills | Status: DC

## 2020-04-22 MED FILL — ATORVASTATIN CALCIUM 80 MG: 80 | 30 days supply | Qty: 30 | Fill #0

## 2020-04-22 MED FILL — NITROGLYCERIN 0.4 MG TAB SL: 0.4 | 8 days supply | Qty: 25 | Fill #0

## 2020-04-22 MED FILL — LOSARTAN POTASSIUM 50 MG TA: 50 | 30 days supply | Qty: 30 | Fill #0

## 2020-04-22 MED FILL — ASPIRIN LOW DOSE 81 MG CHEW: 81 | 30 days supply | Qty: 30 | Fill #0

## 2020-04-22 MED FILL — CARVEDILOL 12.5 MG TABLET: 12.5 | 30 days supply | Qty: 60 | Fill #0

## 2020-04-22 NOTE — Telephone Encounter (Signed)
Currently admitted. TOC call for 5/27 

## 2020-04-22 NOTE — Plan of Care (Signed)
  Problem: Education: Goal: Knowledge of General Education information will improve Description: Including pain rating scale, medication(s)/side effects and non-pharmacologic comfort measures 04/22/2020 1150 by Jarome Lamas, RN Outcome: Adequate for Discharge 04/22/2020 1150 by Jarome Lamas, RN Outcome: Adequate for Discharge

## 2020-04-22 NOTE — Telephone Encounter (Signed)
New Message    Pt has TOC appt 06/03 at 2:15pm

## 2020-04-22 NOTE — Progress Notes (Signed)
CARDIAC REHAB PHASE I   PRE:  Rate/Rhythm: NSR 62  BP:  Sitting: 154/91        SaO2: 96  MODE:  Ambulation: 470 ft HR 78   SaO2 96%  POST:  Rate/Rhythm: NSR 64 BP:  Sitting: 157/100      SaO2: 97 0840 - 0956  Pt ambulated with strong, steady gait. Pt had no complaints with walk. Pt back to chair with spouse at side. BP's noted to be elevated. Provided education to patient and spouse regarding MI, post cath care, and risk factor modification. Provided After MI booklet. Stressed to patient the importance of taking medications for HTN and cholesterol. Stressed f/u with MD after discharge. Reviewed wound care of right wrist s/p cath. Discussed activity restrictions, encouraged walking daily, RPE scale, weather parameters for safe exercise, ans S/S to terminate exercise. Reviewed use and storage of NTG. Pt was referred to CRP2 @ [facility]. Pt/sposue verbalized understanding of education.   Lorin Picket, MS, ACSM EP-C, [facility] 04/22/2020  9:45 AM

## 2020-04-22 NOTE — Discharge Summary (Signed)
Discharge Summary    Patient ID: Emily Villegas MRN: [id number]; DOB: [birth year]  Admit date: 04/20/2020 Discharge date: 04/22/2020  Primary Care Provider: Patient, No Pcp Per  Primary Cardiologist: Dr. Martinique   Discharge Diagnoses    Active Problems:   NSTEMI (non-ST elevated myocardial infarction) [facility])   Essential hypertension  HLD   Diagnostic Studies/Procedures    LEFT HEART CATH AND CORONARY ANGIOGRAPHY 04/21/20  Conclusion  1. Mild nonobstructive mid-LAD stenosis 2. Tapering of the distal circumflex suspicious for intramural hematoma 3. Widely patent dominant RCA 4. Mild hypokinesis of the distal inferior wall/inferoapex with preserved overall LVEF of 55-65%  Recommend: medical therapy   Diagnostic Dominance: Right     Echo 04/21/20 1. Left ventricular ejection fraction, by estimation, is 60 to 65%. The  left ventricle has normal function. The left ventricle has no regional  wall motion abnormalities. Left ventricular diastolic parameters are  consistent with Grade I diastolic  dysfunction (impaired relaxation).  2. Right ventricular systolic function is normal. The right ventricular  size is normal. There is normal pulmonary artery systolic pressure. The  estimated right ventricular systolic pressure is 85.8 mmHg.  3. The mitral valve is normal in structure. No evidence of mitral valve  regurgitation. No evidence of mitral stenosis.  4. The aortic valve is tricuspid. Aortic valve regurgitation is not  visualized. Mild aortic valve sclerosis is present, with no evidence of  aortic valve stenosis.  5. The inferior vena cava is normal in size with greater than 50%  respiratory variability, suggesting right atrial pressure of 3 mmHg.   History of Present Illness     Emily Villegas is a 64 y.o. female with history of untreated hypertension and hyperlipidemia presented with chest pain and found to have elevated troponin.  Onset chest discomfort that  woke her from sleep felt like somebody stepping on her chest.  Tingling in both arm and in her throat.  Pain worsened with ambulation and nausea.  Noted elevated blood pressure and came to ER.  Initial troponin 1189>>1735.  EKG with nonspecific T wave abnormality inferiorly.  Pain improved with nitroglycerin.  Hospital Course     Consultants: None  1. NSTEMI -Peaked at 4359.  Treated with IV heparin.  Cardiac cath showed mild nonobstructive mid LAD stenosis and tapering of distal circumflex suspicious for intramural hematoma.  Recommended medical therapy.  Echocardiogram showed LV function of 60 to 85%, grade 1 diastolic dysfunction.  No recurrent chest pain.  Ambulated well.  Continue medical therapy with aspirin, statin and beta-blocker.  2.  Hypertension -Blood pressure normalized with addition of beta-blocker and ARB  3.  Hyperlipidemia - 04/21/2020: Cholesterol 184; HDL 69; LDL Cholesterol 106; Triglycerides 46; VLDL 9  -LDL goal less than 70 -Continue high intensity statin  Did the patient have an acute coronary syndrome (MI, NSTEMI, STEMI, etc) this admission?:  Yes                               AHA/ACC Clinical Performance & Quality Measures: 1. Aspirin prescribed? - Yes 2. ADP Receptor Inhibitor (Plavix/Clopidogrel, Brilinta/Ticagrelor or Effient/Prasugrel) prescribed (includes medically managed patients)? - No - no stent or PCI 3. Beta Blocker prescribed? - Yes 4. High Intensity Statin (Lipitor 40-80mg  or Crestor 20-40mg ) prescribed? - Yes 5. EF assessed during THIS hospitalization? - Yes 6. For EF <40%, was ACEI/ARB prescribed? - Not Applicable (EF >/= 02%) 7. For EF <40%, Aldosterone Antagonist (Spironolactone or  Eplerenone) prescribed? - Not Applicable (EF >/= 40%) 8. Cardiac Rehab Phase II ordered (including medically managed patients)? - Yes   _____________  Discharge Vitals Blood pressure 139/77, pulse (!) 59, temperature 98 F (36.7 C), temperature source Oral, resp.  rate 15, height 5\' 8"  (1.727 m), weight 69.4 kg, SpO2 98 %.  Filed Weights   04/20/20 2107 04/21/20 0747 04/22/20 0452  Weight: 69.4 kg 69.4 kg 69.4 kg   Physical Exam  Constitutional: She is oriented to person, place, and time and well-developed, well-nourished, and in no distress.  HENT:  Head: Normocephalic and atraumatic.  Eyes: Pupils are equal, round, and reactive to light. Conjunctivae and EOM are normal.  Cardiovascular: Normal rate and regular rhythm.  Right radial cath without hematoma   Pulmonary/Chest: Effort normal and breath sounds normal.  Abdominal: Soft. Bowel sounds are normal.  Musculoskeletal:        General: Normal range of motion.     Cervical back: Normal range of motion and neck supple.  Neurological: She is alert and oriented to person, place, and time.  Skin: Skin is warm and dry.  Psychiatric: Affect normal.   Labs & Radiologic Studies    CBC Recent Labs    04/20/20 2114 04/21/20 0259  WBC 8.5 8.7  HGB 13.7 12.4  HCT 40.1 36.8  MCV 95.7 94.4  PLT 259 241   Basic Metabolic Panel Recent Labs    04/23/20 2114  NA 136  K 3.9  CL 99  CO2 30  GLUCOSE 107*  BUN 13  CREATININE 0.81  CALCIUM 9.0   Liver Function Tests Recent Labs    04/22/20 0500  AST 24  ALT 16  ALKPHOS 69  BILITOT 0.9  PROT 6.7  ALBUMIN 3.7   No results for input(s): LIPASE, AMYLASE in the last 72 hours. High Sensitivity Troponin:   Recent Labs  Lab 04/20/20 2114 04/20/20 2310 04/21/20 0259 04/21/20 0630  TROPONINIHS 1,189* 1,735* 4,359* 4,256*   Hemoglobin A1C Recent Labs    04/21/20 0259  HGBA1C 5.6   Fasting Lipid Panel Recent Labs    04/21/20 0259  CHOL 184  HDL 69  LDLCALC 106*  TRIG 46  CHOLHDL 2.7   Thyroid Function Tests Recent Labs    04/21/20 0259  TSH 2.672   _____________  DG Chest 2 View  Result Date: 04/20/2020 CLINICAL DATA:  Sternal chest pressure. EXAM: CHEST - 2 VIEW COMPARISON:  May 16, 2019 FINDINGS: The lungs are  hyperinflated. There is no evidence of acute infiltrate, pleural effusion or pneumothorax. The heart size and mediastinal contours are within normal limits. There is stable mild to moderate severity scoliosis of the thoracolumbar spine. IMPRESSION: No active cardiopulmonary disease. Electronically Signed   By: May 18, 2019 M.D.   On: 04/20/2020 21:28   CARDIAC CATHETERIZATION  Result Date: 04/21/2020 1. Mild nonobstructive mid-LAD stenosis 2. Tapering of the distal circumflex suspicious for intramural hematoma 3. Widely patent dominant RCA 4. Mild hypokinesis of the distal inferior wall/inferoapex with preserved overall LVEF of 55-65% Recommend: medical therapy  ECHOCARDIOGRAM COMPLETE  Result Date: 04/21/2020    ECHOCARDIOGRAM REPORT   Patient Name:   Emily Villegas Date of Exam: 04/21/2020 Medical Rec #:  04/23/2020         Height:       68.0 in Accession #:    [id number]        Weight:       153.0 lb Date of Birth:  12-24-1955  BSA:          1.824 m Patient Age:    64 years          BP:           111/67 mmHg Patient Gender: F                 HR:           60 bpm. Exam Location:  Inpatient Procedure: 2D Echo, Color Doppler and Cardiac Doppler Indications:    Chest pain  History:        Patient has no prior history of Echocardiogram examinations.                 Risk Factors:Hypertension and Dyslipidemia.  Sonographer:    Ross Ludwig RDCS (AE) Referring Phys: [phone] SCOTT [facility]SE IMPRESSIONS  1. Left ventricular ejection fraction, by estimation, is 60 to 65%. The left ventricle has normal function. The left ventricle has no regional wall motion abnormalities. Left ventricular diastolic parameters are consistent with Grade I diastolic dysfunction (impaired relaxation).  2. Right ventricular systolic function is normal. The right ventricular size is normal. There is normal pulmonary artery systolic pressure. The estimated right ventricular systolic pressure is 21.5 mmHg.  3. The mitral valve is  normal in structure. No evidence of mitral valve regurgitation. No evidence of mitral stenosis.  4. The aortic valve is tricuspid. Aortic valve regurgitation is not visualized. Mild aortic valve sclerosis is present, with no evidence of aortic valve stenosis.  5. The inferior vena cava is normal in size with greater than 50% respiratory variability, suggesting right atrial pressure of 3 mmHg. FINDINGS  Left Ventricle: Left ventricular ejection fraction, by estimation, is 60 to 65%. The left ventricle has normal function. The left ventricle has no regional wall motion abnormalities. The left ventricular internal cavity size was normal in size. There is  no left ventricular hypertrophy. Left ventricular diastolic parameters are consistent with Grade I diastolic dysfunction (impaired relaxation). Right Ventricle: The right ventricular size is normal. No increase in right ventricular wall thickness. Right ventricular systolic function is normal. There is normal pulmonary artery systolic pressure. The tricuspid regurgitant velocity is 2.15 m/s, and  with an assumed right atrial pressure of 3 mmHg, the estimated right ventricular systolic pressure is 21.5 mmHg. Left Atrium: Left atrial size was normal in size. Right Atrium: Right atrial size was normal in size. Pericardium: Trivial pericardial effusion is present. Mitral Valve: The mitral valve is normal in structure. No evidence of mitral valve regurgitation. No evidence of mitral valve stenosis. MV peak gradient, 2.4 mmHg. The mean mitral valve gradient is 1.0 mmHg. Tricuspid Valve: The tricuspid valve is normal in structure. Tricuspid valve regurgitation is trivial. Aortic Valve: The aortic valve is tricuspid. Aortic valve regurgitation is not visualized. Mild aortic valve sclerosis is present, with no evidence of aortic valve stenosis. Aortic valve mean gradient measures 4.0 mmHg. Aortic valve peak gradient measures 7.8 mmHg. Aortic valve area, by VTI measures 2.19  cm. Pulmonic Valve: The pulmonic valve was normal in structure. Pulmonic valve regurgitation is not visualized. Aorta: The aortic root is normal in size and structure. Venous: The inferior vena cava is normal in size with greater than 50% respiratory variability, suggesting right atrial pressure of 3 mmHg. IAS/Shunts: No atrial level shunt detected by color flow Doppler.  LEFT VENTRICLE PLAX 2D LVIDd:         4.10 cm  Diastology LVIDs:  3.00 cm  LV e' lateral:   7.94 cm/s LV PW:         1.65 cm  LV E/e' lateral: 6.9 LV IVS:        1.14 cm  LV e' medial:    5.98 cm/s LVOT diam:     2.20 cm  LV E/e' medial:  9.2 LV SV:         68 LV SV Index:   38 LVOT Area:     3.80 cm  RIGHT VENTRICLE             IVC RV Basal diam:  3.17 cm     IVC diam: 0.92 cm RV S prime:     12.90 cm/s TAPSE (M-mode): 3.3 cm LEFT ATRIUM           Index       RIGHT ATRIUM           Index LA diam:      2.40 cm 1.32 cm/m  RA Area:     14.20 cm LA Vol (A2C): 30.0 ml 16.45 ml/m RA Volume:   36.80 ml  20.18 ml/m LA Vol (A4C): 23.5 ml 12.89 ml/m  AORTIC VALVE AV Area (Vmax):    2.16 cm AV Area (Vmean):   2.23 cm AV Area (VTI):     2.19 cm AV Vmax:           140.00 cm/s AV Vmean:          99.300 cm/s AV VTI:            0.313 m AV Peak Grad:      7.8 mmHg AV Mean Grad:      4.0 mmHg LVOT Vmax:         79.60 cm/s LVOT Vmean:        58.300 cm/s LVOT VTI:          0.180 m LVOT/AV VTI ratio: 0.58  AORTA Ao Root diam: 3.40 cm Ao Asc diam:  3.20 cm MITRAL VALVE               TRICUSPID VALVE MV Area (PHT): 3.91 cm    TR Peak grad:   18.5 mmHg MV Peak grad:  2.4 mmHg    TR Vmax:        215.00 cm/s MV Mean grad:  1.0 mmHg MV Vmax:       0.77 m/s    SHUNTS MV Vmean:      43.7 cm/s   Systemic VTI:  0.18 m MV Decel Time: 194 msec    Systemic Diam: 2.20 cm MV E velocity: 54.80 cm/s MV A velocity: 60.00 cm/s MV E/A ratio:  0.91 Marca Anconaalton Mclean MD Electronically signed by Marca Anconaalton Mclean MD Signature Date/Time: 04/21/2020/2:19:20 PM    Final     Disposition   Pt is being discharged home today in good condition.  Follow-up Plans & Appointments    Follow-up Information    Beatriz StallionKroeger, Krista M., PA-C. Go on 04/30/2020.   Specialty: Physician Assistant Why: @2 :15pm for hospital follow up with Dr. Elvis CoilJordan's PA Contact information: [address] [place] [address] [phone]          Discharge Instructions    Diet - low sodium heart healthy   Complete by: As directed    Discharge instructions   Complete by: As directed    NO HEAVY LIFTING (>10lbs) X 2 WEEKS. NO SEXUAL ACTIVITY X 2 WEEKS. NO DRIVING X 5 DAYS. NO  SOAKING BATHS, HOT TUBS, POOLS, ETC., X 7 DAYS.   Increase activity slowly   Complete by: As directed       Discharge Medications   Allergies as of 04/22/2020   No Known Allergies     Medication List    TAKE these medications   aspirin 81 MG chewable tablet Chew 1 tablet (81 mg total) by mouth daily.   atorvastatin 80 MG tablet Commonly known as: LIPITOR Take 1 tablet (80 mg total) by mouth daily at 6 PM.   carvedilol 12.5 MG tablet Commonly known as: COREG Take 1 tablet (12.5 mg total) by mouth 2 (two) times daily with a meal.   losartan 50 MG tablet Commonly known as: COZAAR Take 1 tablet (50 mg total) by mouth daily.   nitroGLYCERIN 0.4 MG SL tablet Commonly known as: NITROSTAT Place 1 tablet (0.4 mg total) under the tongue every 5 (five) minutes as needed for chest pain.          Outstanding Labs/Studies   Consider OP f/u labs 6-8 weeks given statin initiation this admission.  Duration of Discharge Encounter   Greater than 30 minutes including physician time.  Lorelei Pont, PA 04/22/2020, 9:07 AM

## 2020-04-22 NOTE — Plan of Care (Signed)
  Problem: Education: Goal: Knowledge of General Education information will improve Description: Including pain rating scale, medication(s)/side effects and non-pharmacologic comfort measures Outcome: Adequate for Discharge   

## 2020-04-23 NOTE — Telephone Encounter (Signed)
Patient contacted regarding discharge from [facility] on 04/22/20.  Patient understands to follow up with provider Judy Pimple on 04/30/20 at 2:15 pm at [facility]. Patient understands discharge instructions? yes Patient understands medications and regiment? yes Patient understands to bring all medications to this visit? yes

## 2020-04-24 ENCOUNTER — Telehealth (HOSPITAL_COMMUNITY): Payer: Self-pay

## 2020-04-24 NOTE — Telephone Encounter (Signed)
Pt insurance is active and benefits verified through Medicare a/b Co-pay 0, DED 0/0 met, out of pocket $5,550/$25 met, co-insurance 0%. no pre-authorization required. Passport, 04/24/2020_0 :59am , REF# [id number]  Will contact patient to see if she is interested in the Cardiac Rehab Program. If interested, patient will need to complete follow up appt. Once completed, patient will be contacted for scheduling upon review by the RN Navigator.  2ndary insurance is active and benefits verified through [employer]. Co-pay 0, DED 0/0 met, out of pocket $5,550/$25 met, co-insurance 0%. No pre-authorization required. Passport, [place], REF# [id number]

## 2020-04-30 ENCOUNTER — Other Ambulatory Visit: Payer: Self-pay

## 2020-04-30 ENCOUNTER — Encounter: Payer: Self-pay | Admitting: Medical

## 2020-04-30 ENCOUNTER — Ambulatory Visit (INDEPENDENT_AMBULATORY_CARE_PROVIDER_SITE_OTHER): Payer: Medicare Other | Admitting: Medical

## 2020-04-30 VITALS — BP 110/68 | HR 60 | Temp 97.9°F | Ht 68.0 in | Wt 156.0 lb

## 2020-04-30 DIAGNOSIS — I10 Essential (primary) hypertension: Secondary | ICD-10-CM

## 2020-04-30 DIAGNOSIS — E785 Hyperlipidemia, unspecified: Secondary | ICD-10-CM

## 2020-04-30 DIAGNOSIS — I2510 Atherosclerotic heart disease of native coronary artery without angina pectoris: Secondary | ICD-10-CM | POA: Diagnosis not present

## 2020-04-30 MED ORDER — AMLODIPINE BESYLATE 5 MG PO TABS
5.0000 mg | ORAL_TABLET | Freq: Every day | ORAL | 3 refills | Status: DC
Start: 2020-04-30 — End: 2020-07-23

## 2020-04-30 NOTE — Progress Notes (Signed)
Cardiology Office Note   Date:  04/30/2020   ID:  Emily Villegas, DOB May 17, 1956, MRN [id number]  PCP:  Sigmund Hazel, MD  Cardiologist:  Peter Swaziland, MD EP: None  Chief Complaint  Patient presents with  . Hospitalization Follow-up      History of Present Illness: Emily Villegas is a 64 y.o. female with PMH of recent NSTEMI 2/2 likely SCAD, non-obstructive CAD, HTN, and HLD who presents for for post-hospital follow-up.  She was admitted to [facility] from 04/20/20-04/22/20 after presenting with chest pain. HsTroponins were elevated (peaked at 1735) and EKG showed non-specific T wave abnormalities. She underwent a cardiac catheterization which showed mild non-obstructive mLAD stenosis and tapering of the distal circumflex suspicious for intramural hematoma likely 2/2 SCAD. Echo showed EF 60-65%, G1DD, and no significant valvular abnormalities. She was started on aspirin, statin, BBlocker, and ARB for risk factor modifications.   She presents today for post-hospital follow-up. She has continued to have some low grade chest pressure intermittently since discharge. She is struggling to know when to be concerned. She has not had any arm pain which occurred prior to her ED presentation. She did trial 1 SL nitro at home which helped with the pressure sensation. No complaints of SOB, LE edema, orthopnea, PND, dizziness, lightheadedness, or syncope. She is eager to get back to her normal activities of walking and exercise routines.    Past Medical History:  Diagnosis Date  . Hyperlipidemia   . Hypertension     Past Surgical History:  Procedure Laterality Date  . LEFT HEART CATH AND CORONARY ANGIOGRAPHY N/A 04/21/2020   Procedure: LEFT HEART CATH AND CORONARY ANGIOGRAPHY;  Surgeon: Tonny Bollman, MD;  Location: [facility] INVASIVE CV LAB;  Service: Cardiovascular;  Laterality: N/A;     Current Outpatient Medications  Medication Sig Dispense Refill  . aspirin 81 MG chewable tablet Chew 1  tablet (81 mg total) by mouth daily. 30 tablet 11  . atorvastatin (LIPITOR) 80 MG tablet Take 1 tablet (80 mg total) by mouth daily at 6 PM. 30 tablet 11  . carvedilol (COREG) 12.5 MG tablet Take 1 tablet (12.5 mg total) by mouth 2 (two) times daily with a meal. 60 tablet 11  . nitroGLYCERIN (NITROSTAT) 0.4 MG SL tablet Place 1 tablet (0.4 mg total) under the tongue every 5 (five) minutes as needed for chest pain. 25 tablet 1  . amLODipine (NORVASC) 5 MG tablet Take 1 tablet (5 mg total) by mouth daily. 90 tablet 3   No current facility-administered medications for this visit.    Allergies:   Patient has no known allergies.    Social History:  The patient  reports that she has never smoked. She has never used smokeless tobacco. She reports current alcohol use. She reports that she does not use drugs.   Family History:  The patient's family history includes Breast cancer (age of onset: 57) in her niece; Heart attack in her father.    ROS:  Please see the history of present illness.   Otherwise, review of systems are positive for none.   All other systems are reviewed and negative.    PHYSICAL EXAM: VS:  BP 110/68   Pulse 60   Temp 97.9 F (36.6 C)   Ht 5\' 8"  (1.727 m)   Wt 156 lb (70.8 kg)   SpO2 95%   BMI 23.72 kg/m  , BMI Body mass index is 23.72 kg/m. GEN: Well nourished, well developed, in no acute distress HEENT:  sclera anicteric Neck: no JVD, carotid bruits, or masses Cardiac: RRR; no murmurs, rubs, or gallops, no edema  Respiratory:  clear to auscultation bilaterally, normal work of breathing GI: soft, nontender, nondistended, + BS MS: no deformity or atrophy Skin: warm and dry, no rash Neuro:  Strength and sensation are intact Psych: euthymic mood, full affect   EKG:  EKG is ordered today. The ekg ordered today demonstrates sinus rhythm, rate 60 bpm, incomplete RBBB, non-specific T wave abnormalities, no STE/D, no significant change from previous.    Recent  Labs: 04/20/2020: BUN 13; Creatinine, Ser 0.81; Potassium 3.9; Sodium 136 04/21/2020: Hemoglobin 12.4; Platelets 241; TSH 2.672 04/22/2020: ALT 16    Lipid Panel    Component Value Date/Time   CHOL 184 04/21/2020 0259   TRIG 46 04/21/2020 0259   HDL 69 04/21/2020 0259   CHOLHDL 2.7 04/21/2020 0259   VLDL 9 04/21/2020 0259   LDLCALC 106 (H) 04/21/2020 0259      Wt Readings from Last 3 Encounters:  04/30/20 156 lb (70.8 kg)  04/22/20 152 lb 14.4 oz (69.4 kg)  06/02/13 147 lb 12.8 oz (67 kg)      Other studies Reviewed: Additional studies/ records that were reviewed today include:   LEFT HEART CATH AND CORONARY ANGIOGRAPHY 04/21/20  Conclusion  1. Mild nonobstructive mid-LAD stenosis 2. Tapering of the distal circumflex suspicious for intramural hematoma 3. Widely patent dominant RCA 4. Mild hypokinesis of the distal inferior wall/inferoapex with preserved overall LVEF of 55-65%  Recommend: medical therapy   Diagnostic Dominance: Right     Echo 04/21/20 1. Left ventricular ejection fraction, by estimation, is 60 to 65%. The  left ventricle has normal function. The left ventricle has no regional  wall motion abnormalities. Left ventricular diastolic parameters are  consistent with Grade I diastolic  dysfunction (impaired relaxation).  2. Right ventricular systolic function is normal. The right ventricular  size is normal. There is normal pulmonary artery systolic pressure. The  estimated right ventricular systolic pressure is 50.3 mmHg.  3. The mitral valve is normal in structure. No evidence of mitral valve  regurgitation. No evidence of mitral stenosis.  4. The aortic valve is tricuspid. Aortic valve regurgitation is not  visualized. Mild aortic valve sclerosis is present, with no evidence of  aortic valve stenosis.  5. The inferior vena cava is normal in size with greater than 50%  respiratory variability, suggesting right atrial pressure of 3 mmHg.       ASSESSMENT AND PLAN:  1. Recent NSTEMI likely 2/2 SCAD with non-obstructive CAD: no recurrent chest pain.  - Continue aspirin and statin - Continue carvedilol - Will start amlodipine 5mg  daily for antianginal effects, and as a result, will stop losartan at this time.  - Could consider imdur if she is requiring SL nitro regularly.  2. HTN: BP 110/68 today - Continue carvedilol - Will stop losartan and start amlodipine as above  3. HLD: LDL 106. Started on atorvastatin during admission.  - Continue atorvastatin - Will repeat FLP/LFTs in 6 weeks for close monitoring.     Current medicines are reviewed at length with the patient today.  The patient does not have concerns regarding medicines.  The following changes have been made:  As above  Labs/ tests ordered today include:   Orders Placed This Encounter  Procedures  . Lipid panel  . Hepatic function panel  . EKG 12-Lead     Disposition:   FU with Dr. Martinique in 3 months  Signed, Beatriz Stallion, PA-C  04/30/2020 6:06 PM

## 2020-04-30 NOTE — Patient Instructions (Addendum)
Medication Instructions:   STOP LOSARTAN  START Amlodipine 5 mg daily  *If you need a refill on your cardiac medications before your next appointment, please call your pharmacy*  Lab Work: Your physician recommends that you return for lab work in 6 weeks: 06/11/2020  Fasting Lipid Panel-DO NOT EAT OR DRINK PAST MIDNIGHT. OK TO HAVE WATER  Hepatic (Liver) Function Test  If you have labs (blood work) drawn today and your tests are completely normal, you will receive your results only by: Marland Kitchen MyChart Message (if you have MyChart) OR . A paper copy in the mail If you have any lab test that is abnormal or we need to change your treatment, we will call you to review the results.  Testing/Procedures: NONE ordered at this time of appointment   Follow-Up: At [facility], you and your health needs are our priority.  As part of our continuing mission to provide you with exceptional heart care, we have created designated Provider Care Teams.  These Care Teams include your primary Cardiologist (physician) and Advanced Practice Providers (APPs -  Physician Assistants and Nurse Practitioners) who all work together to provide you with the care you need, when you need it.  We recommend signing up for the patient portal called "MyChart".  Sign up information is provided on this After Visit Summary.  MyChart is used to connect with patients for Virtual Visits (Telemedicine).  Patients are able to view lab/test results, encounter notes, upcoming appointments, etc.  Non-urgent messages can be sent to your provider as well.   To learn more about what you can do with MyChart, go to [email or web address].    Your next appointment:   3 month(s)  The format for your next appointment:   In Person  Provider:   Peter Swaziland, MD  Other Instructions

## 2020-05-05 ENCOUNTER — Telehealth (HOSPITAL_COMMUNITY): Payer: Self-pay | Admitting: *Deleted

## 2020-05-05 NOTE — Telephone Encounter (Signed)
-----   Message from Beatriz Stallion, PA-C sent at 05/05/2020  2:46 PM EDT ----- Regarding: RE: ?? Ready for scheduling Cardiac rehab Hey Emily Villegas - I am fine with her going forward with cardiac rehab. She can certainly reach out if symptoms worsen before her follow-up with Dr. Swaziland in September. She was quite active prior to her event and is eager to get back to exercising. Thanks for following up.  ----- Message ----- From: Chelsea Aus, RN Sent: 05/05/2020  10:28 AM EDT To: Beatriz Stallion, PA-C Subject: ?? Ready for scheduling Cardiac rehab           Dot Lanes,  Reaching out to you about the above pt seen by you on 04/30/20.  Pt reports angina and amlodipine was added to her medications for antianginal.  Unclear to me if she is okay to proceed with scheduling at this point.  Is she to notify the office if she does not see any changes in her level or frequency of angina?  Did not want to hold on scheduling until the end of September for May cardiac event.  Her next follow up appt is with Dr. Swaziland on 9/28.  Thanks your your input Karlene Lineman RN, BSN Cardiac and Pulmonary Rehab Nurse Navigator

## 2020-05-12 NOTE — Telephone Encounter (Signed)
Called patient to see if she was interested in participating in the Cardiac Rehab Program. Patient stated not at this time. ° °Closed referral °

## 2020-06-08 ENCOUNTER — Other Ambulatory Visit: Payer: Self-pay | Admitting: *Deleted

## 2020-06-08 DIAGNOSIS — E785 Hyperlipidemia, unspecified: Secondary | ICD-10-CM

## 2020-06-08 NOTE — Telephone Encounter (Signed)
She had SCAD spontaneous coronary dissection. This is sometimes associated with FMD- fibromuscular dysplasia which can be seen in other vascular territories including carotids, renals, vertebrals. Since she is having left sided numbness I think it would be appropriate to do a head and neck CT angiogram to assess.  Please arrange.   Genifer Lazenby [place] MD, [facility]

## 2020-06-09 ENCOUNTER — Other Ambulatory Visit: Payer: Self-pay

## 2020-06-09 DIAGNOSIS — R202 Paresthesia of skin: Secondary | ICD-10-CM

## 2020-06-09 DIAGNOSIS — I10 Essential (primary) hypertension: Secondary | ICD-10-CM

## 2020-06-09 DIAGNOSIS — R200 Anesthesia of skin: Secondary | ICD-10-CM

## 2020-06-09 NOTE — Progress Notes (Signed)
Spoke to patient Dr.Jordan advised to have a head and neck ct angio.Scheduler will call back to schedule appointment.

## 2020-06-09 NOTE — Telephone Encounter (Signed)
Spoke to patient Dr.Jordan's recommendation given.Scheduler will call back to schedule head and neck ct angio.She will have a bmet 7/14.

## 2020-06-10 ENCOUNTER — Other Ambulatory Visit: Payer: Self-pay

## 2020-06-10 ENCOUNTER — Telehealth: Payer: Self-pay | Admitting: Cardiology

## 2020-06-10 DIAGNOSIS — I10 Essential (primary) hypertension: Secondary | ICD-10-CM

## 2020-06-10 DIAGNOSIS — R202 Paresthesia of skin: Secondary | ICD-10-CM

## 2020-06-10 DIAGNOSIS — R200 Anesthesia of skin: Secondary | ICD-10-CM

## 2020-06-10 LAB — LIPID PANEL
Chol/HDL Ratio: 2.3 ratio (ref 0.0–4.4)
Cholesterol, Total: 140 mg/dL (ref 100–199)
HDL: 61 mg/dL (ref >39)
LDL Chol Calc (NIH): 69 mg/dL (ref 0–99)
Triglycerides: 44 mg/dL (ref 0–149)
VLDL Cholesterol Cal: 10 mg/dL (ref 5–40)

## 2020-06-10 LAB — BASIC METABOLIC PANEL
BUN/Creatinine Ratio: 14 (ref 12–28)
BUN: 12 mg/dL (ref 8–27)
CO2: 23 mmol/L (ref 20–29)
Calcium: 8.8 mg/dL (ref 8.7–10.3)
Chloride: 101 mmol/L (ref 96–106)
Creatinine, Ser: 0.83 mg/dL (ref 0.57–1.00)
GFR calc Af Amer: 86 mL/min/{1.73_m2} (ref >59)
GFR calc non Af Amer: 74 mL/min/{1.73_m2} (ref >59)
Glucose: 84 mg/dL (ref 65–99)
Potassium: 4.3 mmol/L (ref 3.5–5.2)
Sodium: 137 mmol/L (ref 134–144)

## 2020-06-10 NOTE — Telephone Encounter (Signed)
Spoke with patient regarding appointment for CTA neck and head scheduled Monday 06/29/20 at 10:50 am arrival time is 10:30 am [facility] [address]----Liquids only 4 hours prior.  Will mail information to patient----she voiced her understanding.

## 2020-06-25 ENCOUNTER — Other Ambulatory Visit: Payer: Medicare Other

## 2020-06-29 ENCOUNTER — Other Ambulatory Visit: Payer: Self-pay

## 2020-06-29 ENCOUNTER — Ambulatory Visit
Admission: RE | Admit: 2020-06-29 | Discharge: 2020-06-29 | Disposition: A | Discharge status: Home / Self Care | Payer: Medicare Other | Source: Ambulatory Visit | Attending: Cardiology | Admitting: Cardiology

## 2020-06-29 DIAGNOSIS — I10 Essential (primary) hypertension: Secondary | ICD-10-CM

## 2020-06-29 DIAGNOSIS — R200 Anesthesia of skin: Secondary | ICD-10-CM

## 2020-06-29 MED ORDER — IOPAMIDOL (ISOVUE-370) INJECTION 76%
75.0000 mL | Freq: Once | INTRAVENOUS | Status: AC | PRN
  Administered 2020-06-29: 75 mL via INTRAVENOUS

## 2020-07-08 ENCOUNTER — Ambulatory Visit
Admission: RE | Admit: 2020-07-08 | Discharge: 2020-07-08 | Disposition: A | Discharge status: Home / Self Care | Payer: Medicare Other | Source: Ambulatory Visit | Attending: Family Medicine | Admitting: Family Medicine

## 2020-07-08 ENCOUNTER — Other Ambulatory Visit: Payer: Self-pay

## 2020-07-08 DIAGNOSIS — E2839 Other primary ovarian failure: Secondary | ICD-10-CM

## 2020-07-08 DIAGNOSIS — Z1231 Encounter for screening mammogram for malignant neoplasm of breast: Secondary | ICD-10-CM

## 2020-07-09 ENCOUNTER — Telehealth: Payer: Self-pay | Admitting: Cardiology

## 2020-07-09 DIAGNOSIS — R930 Abnormal findings on diagnostic imaging of skull and head, not elsewhere classified: Secondary | ICD-10-CM

## 2020-07-09 DIAGNOSIS — R200 Anesthesia of skin: Secondary | ICD-10-CM

## 2020-07-09 DIAGNOSIS — R202 Paresthesia of skin: Secondary | ICD-10-CM

## 2020-07-09 NOTE — Telephone Encounter (Signed)
CTA results discussed with patient, patient verbalized understanding.   Referral placed to neurology.

## 2020-07-09 NOTE — Telephone Encounter (Signed)
Follow Up:     Returning a call from Tuesday, concerning her CT results.

## 2020-07-14 ENCOUNTER — Other Ambulatory Visit: Payer: Self-pay

## 2020-07-14 ENCOUNTER — Telehealth: Payer: Self-pay | Admitting: Diagnostic Neuroimaging

## 2020-07-14 ENCOUNTER — Encounter: Payer: Self-pay | Admitting: Diagnostic Neuroimaging

## 2020-07-14 ENCOUNTER — Ambulatory Visit (INDEPENDENT_AMBULATORY_CARE_PROVIDER_SITE_OTHER): Payer: Medicare Other | Admitting: Diagnostic Neuroimaging

## 2020-07-14 VITALS — BP 109/72 | HR 66 | Ht 68.0 in | Wt 157.0 lb

## 2020-07-14 DIAGNOSIS — I249 Acute ischemic heart disease, unspecified: Secondary | ICD-10-CM

## 2020-07-14 DIAGNOSIS — R202 Paresthesia of skin: Secondary | ICD-10-CM | POA: Diagnosis not present

## 2020-07-14 DIAGNOSIS — R200 Anesthesia of skin: Secondary | ICD-10-CM

## 2020-07-14 NOTE — Telephone Encounter (Signed)
[employer]ic[employer] Harvey: NPR Ref # Zollie Scale K order sent to GI. They will reach out to the patient to schedule.

## 2020-07-14 NOTE — Patient Instructions (Signed)
  LEFT SIDED NUMBNESS  - check MRI brain - continue aspirin, statin, BP control

## 2020-07-14 NOTE — Progress Notes (Signed)
[facility]  PATIENT: Emily Villegas DOB: 1956-07-05  REFERRING CLINICIAN: Sigmund Hazel, MD HISTORY FROM: patient  REASON FOR VISIT: new consult    HISTORICAL  CHIEF COMPLAINT:  Chief Complaint  Patient presents with  . Numbness and tingling, abnl CT head    rm 7 New Pt, "numbness, tingling on left side, 2 episodes of left leg numbness, left arm numbness"    HISTORY OF PRESENT ILLNESS:   64 year old female with numbness and tingling.  1 year ago patient had 30-minute episode of left leg numbness and tingling which slightly improved.  In June 2021 patient had another episode of left face, left arm, left leg and left torso numbness and tingling lasting for 1 day.  Had CTA of the head and neck which was unremarkable for vascular occlusion or dissection.  Small hypodensity raise possibility of chronic infarct in the right hemisphere.  Patient had hospitalization in May 2021 for non-ST elevation MI.   REVIEW OF SYSTEMS: Full 14 system review of systems performed and negative with exception of: As per HPI.  ALLERGIES: No Known Allergies  HOME MEDICATIONS: Outpatient Medications Prior to Visit  Medication Sig Dispense Refill  . amLODipine (NORVASC) 5 MG tablet Take 1 tablet (5 mg total) by mouth daily. 90 tablet 3  . aspirin 81 MG chewable tablet Chew 1 tablet (81 mg total) by mouth daily. 30 tablet 11  . atorvastatin (LIPITOR) 80 MG tablet Take 1 tablet (80 mg total) by mouth daily at 6 PM. 30 tablet 11  . carvedilol (COREG) 12.5 MG tablet Take 1 tablet (12.5 mg total) by mouth 2 (two) times daily with a meal. 60 tablet 11  . nitroGLYCERIN (NITROSTAT) 0.4 MG SL tablet Place 1 tablet (0.4 mg total) under the tongue every 5 (five) minutes as needed for chest pain. 25 tablet 1   No facility-administered medications prior to visit.    PAST MEDICAL HISTORY: Past Medical History:  Diagnosis Date  . Hyperlipidemia   . Hypertension     PAST SURGICAL  HISTORY: Past Surgical History:  Procedure Laterality Date  . LEFT HEART CATH AND CORONARY ANGIOGRAPHY N/A 04/21/2020   Procedure: LEFT HEART CATH AND CORONARY ANGIOGRAPHY;  Surgeon: Tonny Bollman, MD;  Location: [facility] INVASIVE CV LAB;  Service: Cardiovascular;  Laterality: N/A;    FAMILY HISTORY: Family History  Problem Relation Age of Onset  . Heart attack Father        late 4s  . Breast cancer Niece 48    SOCIAL HISTORY: Social History   Socioeconomic History  . Marital status: Married    Spouse name: Casimiro Needle  . Number of children: Not on file  . Years of education: Not on file  . Highest education level: Master's degree (e.g., MA, MS, MEng, MEd, MSW, MBA)  Occupational History  . Not on file  Tobacco Use  . Smoking status: Never Smoker  . Smokeless tobacco: Never Used  Substance and Sexual Activity  . Alcohol use: Yes    Alcohol/week: 7.0 standard drinks    Types: 7 Glasses of wine per week    Comment: 1-2 drinks most days of the week  . Drug use: No  . Sexual activity: Not on file  Other Topics Concern  . Not on file  Social History Narrative   Lives with husband   Caffeine- coffee 2 c daily   Social Determinants of Health   Financial Resource Strain:   . Difficulty of Paying Living Expenses:   Food Insecurity:   .  Worried About Programme researcher, broadcasting/film/video in the Last Year:   . Barista in the Last Year:   Transportation Needs:   . Freight forwarder (Medical):   Marland Kitchen Lack of Transportation (Non-Medical):   Physical Activity:   . Days of Exercise per Week:   . Minutes of Exercise per Session:   Stress:   . Feeling of Stress :   Social Connections:   . Frequency of Communication with Friends and Family:   . Frequency of Social Gatherings with Friends and Family:   . Attends Religious Services:   . Active Member of Clubs or Organizations:   . Attends Banker Meetings:   Marland Kitchen Marital Status:   Intimate Partner Violence:   . Fear of Current  or Ex-Partner:   . Emotionally Abused:   Marland Kitchen Physically Abused:   . Sexually Abused:      PHYSICAL EXAM  GENERAL EXAM/CONSTITUTIONAL: Vitals:  Vitals:   07/14/20 0818  BP: 109/72  Pulse: 66  Weight: 157 lb (71.2 kg)  Height: 5\' 8"  (1.727 m)     Body mass index is 23.87 kg/m. Wt Readings from Last 3 Encounters:  07/14/20 157 lb (71.2 kg)  04/30/20 156 lb (70.8 kg)  04/22/20 152 lb 14.4 oz (69.4 kg)     Patient is in no distress; well developed, nourished and groomed; neck is supple  CARDIOVASCULAR:  Examination of carotid arteries is normal; no carotid bruits  Regular rate and rhythm, no murmurs  Examination of peripheral vascular system by observation and palpation is normal  EYES:  Ophthalmoscopic exam of optic discs and posterior segments is normal; no papilledema or hemorrhages  No exam data present  MUSCULOSKELETAL:  Gait, strength, tone, movements noted in Neurologic exam below  NEUROLOGIC: MENTAL STATUS:  No flowsheet data found.  awake, alert, oriented to person, place and time  recent and remote memory intact  normal attention and concentration  language fluent, comprehension intact, naming intact  fund of knowledge appropriate  CRANIAL NERVE:   2nd - no papilledema on fundoscopic exam  2nd, 3rd, 4th, 6th - pupils equal and reactive to light, visual fields full to confrontation, extraocular muscles intact, no nystagmus  5th - facial sensation symmetric  7th - facial strength symmetric  8th - hearing intact  9th - palate elevates symmetrically, uvula midline  11th - shoulder shrug symmetric  12th - tongue protrusion midline  MOTOR:   normal bulk and tone, full strength in the BUE, BLE  SENSORY:   normal and symmetric to light touch, temperature, vibration  COORDINATION:   finger-nose-finger, fine finger movements normal  REFLEXES:   deep tendon reflexes present and symmetric  GAIT/STATION:   narrow based  gait     DIAGNOSTIC DATA (LABS, IMAGING, TESTING) - I reviewed patient records, labs, notes, testing and imaging myself where available.  Lab Results  Component Value Date   WBC 8.7 04/21/2020   HGB 12.4 04/21/2020   HCT 36.8 04/21/2020   MCV 94.4 04/21/2020   PLT 241 04/21/2020      Component Value Date/Time   NA 137 06/10/2020 0822   K 4.3 06/10/2020 0822   CL 101 06/10/2020 0822   CO2 23 06/10/2020 0822   GLUCOSE 84 06/10/2020 0822   GLUCOSE 107 (H) 04/20/2020 2114   BUN 12 06/10/2020 0822   CREATININE 0.83 06/10/2020 0822   CALCIUM 8.8 06/10/2020 0822   PROT 6.7 04/22/2020 0500   ALBUMIN 3.7 04/22/2020 0500  AST 24 04/22/2020 0500   ALT 16 04/22/2020 0500   ALKPHOS 69 04/22/2020 0500   BILITOT 0.9 04/22/2020 0500   GFRNONAA 74 06/10/2020 0822   GFRAA 86 06/10/2020 0822   Lab Results  Component Value Date   CHOL 140 06/10/2020   HDL 61 06/10/2020   LDLCALC 69 06/10/2020   TRIG 44 06/10/2020   CHOLHDL 2.3 06/10/2020   Lab Results  Component Value Date   HGBA1C 5.6 04/21/2020   No results found for: VITAMINB12 Lab Results  Component Value Date   TSH 2.672 04/21/2020    06/29/20 CTA head / neck [I reviewed images myself and agree with interpretation. -VRP]  1. No atherosclerosis or stenosis on CTA head and neck. 2. However, there are age indeterminate small vessel infarcts in the right hemisphere white matter (posterior right corona radiata), and in the left cerebellum. The former could be the symptomatic lesion with respect to left side paresthesia. No intracranial hemorrhage or mass effect.  04/21/20 left heart cath 1. Mild nonobstructive mid-LAD stenosis 2. Tapering of the distal circumflex suspicious for intramural hematoma 3. Widely patent dominant RCA 4. Mild hypokinesis of the distal inferior wall/inferoapex with preserved overall LVEF of 55-65%   ASSESSMENT AND PLAN  64 y.o. year old female here with :  Dx:  1. Numbness and tingling       PLAN:  LEFT SIDED NUMBNESS (TIA vs stroke) - check MRI brain - continue aspirin, statin, BP control  Orders Placed This Encounter  Procedures  . MR BRAIN W WO CONTRAST    Return pending test results, for pending if symptoms worsen or fail to improve.    Suanne Marker, MD 07/14/2020, 8:53 AM Certified in Neurology, Neurophysiology and Neuroimaging  [facility] [address] [place], [place] [address] [phone]

## 2020-07-19 NOTE — Progress Notes (Signed)
Cardiology Office Note   Date:  07/23/2020   ID:  Emily Villegas, DOB 1956-06-26, MRN [id number]  PCP:  Sigmund Hazel, MD  Cardiologist:  Shyheim Tanney [place], MD EP: None  Chief Complaint  Patient presents with  . Coronary Artery Disease      History of Present Illness: Emily Villegas is a 64 y.o. female with PMH of recent NSTEMI 2/2 ?SCAD, non-obstructive CAD, HTN, and HLD who presents for  follow-up.  She was admitted to [facility] from 04/20/20-04/22/20 after presenting with chest pain and severe HTN.  HsTroponins were elevated (peaked at 4359) and EKG showed non-specific T wave abnormalities. She underwent a cardiac catheterization which showed mild non-obstructive mLAD stenosis and tapering of the distal circumflex suspicious for intramural hematoma likely 2/2 SCAD. Vessels were quite tortuous.  Echo showed EF 60-65%, G1DD, and no significant valvular abnormalities. She was started on aspirin, statin, BBlocker. On amlodipine for HTN.   She had follow up CT head and neck which showed no evidence of fibromuscular dysplasia or aneurysmal disease. It did show indeterminate small vessel infarcts in the right hemisphere white matter (posterior right corona radiata), and in the left cerebellum. She has recently seen neurology and MRI is planned.   On follow up today she is doing well. Denies any recurrent chest pain. No palpitations. Tolerating medication well. Having some issues with her hip that are limiting her activity.   Past Medical History:  Diagnosis Date  . Hyperlipidemia   . Hypertension     Past Surgical History:  Procedure Laterality Date  . LEFT HEART CATH AND CORONARY ANGIOGRAPHY N/A 04/21/2020   Procedure: LEFT HEART CATH AND CORONARY ANGIOGRAPHY;  Surgeon: Tonny Bollman, MD;  Location: [facility] INVASIVE CV LAB;  Service: Cardiovascular;  Laterality: N/A;     Current Outpatient Medications  Medication Sig Dispense Refill  . aspirin 81 MG chewable tablet Chew 1 tablet  (81 mg total) by mouth daily. 30 tablet 11  . atorvastatin (LIPITOR) 40 MG tablet Take 1 tablet (40 mg total) by mouth daily. 90 tablet 3  . carvedilol (COREG) 12.5 MG tablet Take 1 tablet (12.5 mg total) by mouth 2 (two) times daily with a meal. 60 tablet 11  . nitroGLYCERIN (NITROSTAT) 0.4 MG SL tablet Place 1 tablet (0.4 mg total) under the tongue every 5 (five) minutes as needed for chest pain. 25 tablet 1   No current facility-administered medications for this visit.    Allergies:   Patient has no known allergies.    Social History:  The patient  reports that she has never smoked. She has never used smokeless tobacco. She reports current alcohol use of about 7.0 standard drinks of alcohol per week. She reports that she does not use drugs.   Family History:  The patient's family history includes Breast cancer (age of onset: 36) in her niece; Heart attack in her father.    ROS:  Please see the history of present illness.   Otherwise, review of systems are positive for none.   All other systems are reviewed and negative.    PHYSICAL EXAM: VS:  BP (!) 98/56   Pulse 70   Ht 5\' 8"  (1.727 m)   Wt 156 lb (70.8 kg)   SpO2 98%   BMI 23.72 kg/m  , BMI Body mass index is 23.72 kg/m. GEN: Well nourished, well developed, in no acute distress HEENT: sclera anicteric Neck: no JVD, carotid bruits, or masses Cardiac: RRR; no murmurs, rubs, or gallops, no  edema  Respiratory:  clear to auscultation bilaterally, normal work of breathing GI: soft, nontender, nondistended, + BS MS: no deformity or atrophy Skin: warm and dry, no rash Neuro:  Strength and sensation are intact Psych: euthymic mood, full affect   EKG:  EKG is not ordered today.   Recent Labs: 04/21/2020: Hemoglobin 12.4; Platelets 241; TSH 2.672 04/22/2020: ALT 16 06/10/2020: BUN 12; Creatinine, Ser 0.83; Potassium 4.3; Sodium 137    Lipid Panel    Component Value Date/Time   CHOL 140 06/10/2020 0822   TRIG 44 06/10/2020  0822   HDL 61 06/10/2020 0822   CHOLHDL 2.3 06/10/2020 0822   CHOLHDL 2.7 04/21/2020 0259   VLDL 9 04/21/2020 0259   LDLCALC 69 06/10/2020 0822      Wt Readings from Last 3 Encounters:  07/23/20 156 lb (70.8 kg)  07/14/20 157 lb (71.2 kg)  04/30/20 156 lb (70.8 kg)      Other studies Reviewed: Additional studies/ records that were reviewed today include:   LEFT HEART CATH AND CORONARY ANGIOGRAPHY 04/21/20  Conclusion  1. Mild nonobstructive mid-LAD stenosis 2. Tapering of the distal circumflex suspicious for intramural hematoma 3. Widely patent dominant RCA 4. Mild hypokinesis of the distal inferior wall/inferoapex with preserved overall LVEF of 55-65%  Recommend: medical therapy   Diagnostic Dominance: Right     Echo 04/21/20 1. Left ventricular ejection fraction, by estimation, is 60 to 65%. The  left ventricle has normal function. The left ventricle has no regional  wall motion abnormalities. Left ventricular diastolic parameters are  consistent with Grade I diastolic  dysfunction (impaired relaxation).  2. Right ventricular systolic function is normal. The right ventricular  size is normal. There is normal pulmonary artery systolic pressure. The  estimated right ventricular systolic pressure is 21.5 mmHg.  3. The mitral valve is normal in structure. No evidence of mitral valve  regurgitation. No evidence of mitral stenosis.  4. The aortic valve is tricuspid. Aortic valve regurgitation is not  visualized. Mild aortic valve sclerosis is present, with no evidence of  aortic valve stenosis.  5. The inferior vena cava is normal in size with greater than 50%  respiratory variability, suggesting right atrial pressure of 3 mmHg.   CT ANGIOGRAPHY HEAD AND NECK  TECHNIQUE: Multidetector CT imaging of the head and neck was performed using the standard protocol during bolus administration of intravenous contrast. Multiplanar CT image reconstructions and MIPs  were obtained to evaluate the vascular anatomy. Carotid stenosis measurements (when applicable) are obtained utilizing NASCET criteria, using the distal internal carotid diameter as the denominator.  CONTRAST:  64mL ISOVUE-370 IOPAMIDOL (ISOVUE-370) INJECTION 76%  COMPARISON:  None.  FINDINGS: CT HEAD  Brain: Cerebral volume is within normal limits for age. No midline shift, ventriculomegaly, mass effect, evidence of mass lesion, intracranial hemorrhage or evidence of cortically based acute infarction.  Focal hypodensity in the posterior right corona radiata (series 3, image 20 and series 8, image 36) is age indeterminate but has a more chronic appearance.  There is also evidence of a small chronic infarct in the left cerebellum on series 3, image 9.  Elsewhere gray-white matter differentiation is within normal limits. No cortical encephalomalacia identified.  Calvarium and skull base: Negative.  Paranasal sinuses: Clear.  Orbits: Orbit and scalp soft tissues are within normal limits.  CTA NECK  Skeleton: No acute osseous abnormality identified. Mild for age cervical spine degeneration. Exaggerated upper thoracic kyphosis and mild levoconvex scoliosis.  Upper chest: Negative.  Other  neck: Tiny hypodensities in the thyroid do not meet size criteria for follow-up (ref: J Am Coll Radiol. 2015 Feb;12(2): 143-50).The glottis is closed. Neck soft tissue contours are within normal limits. No lymphadenopathy.  Aortic arch: 3 vessel arch configuration.  No arch atherosclerosis.  Right carotid system: Negative.  Left carotid system: Negative; anatomic variation in the form of a arterial branch arising from the medial bifurcation with a small associated infundibulum (series 11, image 100). This is probably the ascending pharyngeal artery on that side.  Vertebral arteries: Negative. Fairly codominant vertebral arteries. There is some right V1 segment  paravertebral venous contrast contamination.  CTA HEAD  Posterior circulation: Fairly codominant distal vertebral arteries are normal to the vertebrobasilar junction. Normal PICA origins. No stenosis. Patent vertebrobasilar junction and basilar artery without stenosis. Normal SCA and right PCA origins. Fetal type left PCA origin. Both posterior communicating arteries are present. Bilateral PCA branches are within normal limits.  Anterior circulation: Both ICA siphons are patent. Trace if any siphon plaque. No ICA siphon stenosis. Normal ophthalmic and posterior communicating artery origins. Patent carotid termini. Normal MCA and ACA origins. Dominant right ACA A1 segment. Diminutive anterior communicating artery. The right A2 and distal branches are also dominant. Left MCA M1 segment bifurcates early without stenosis. Left MCA branches are within normal limits. Right MCA M1 segment and trifurcation are patent without stenosis. Right MCA branches are within normal limits.  Venous sinuses: Patent. Dominant right transverse and sigmoid sinuses. Normal arachnoid granulation at the junction of the left transverse and sigmoid.  Anatomic variants: Small innominate arterial branch arising from the left carotid bifurcation with an infundibulum. Fetal type left PCA origin. Dominant right ACA. Dominant right transverse and sigmoid venous sinuses.  Review of the MIP images confirms the above findings  IMPRESSION: 1. No atherosclerosis or stenosis on CTA head and neck.  2. However, there are age indeterminate small vessel infarcts in the right hemisphere white matter (posterior right corona radiata), and in the left cerebellum. The former could be the symptomatic lesion with respect to left side paresthesia. No intracranial hemorrhage or mass effect.   Electronically Signed   By: Odessa Fleming M.D.   On: 06/29/2020 13:00    ASSESSMENT AND PLAN:  1.  NSTEMI in May 2021  Not  definitely SCAD but no other clear etiology: no recurrent chest pain.  - Continue aspirin and statin- OK to reduce lipitor to 40 mg daily.  - Continue carvedilol - Will stop amlodipine due to low BP   2. HTN: BP low today - Continue carvedilol - Will stop amlodipine as above  3. HLD: LDL 106>>69. Started on atorvastatin during admission.  - reduce atorvastatin to 40 mg - Will repeat FLP/LFTs at follow up visit.  4. ?Right hemispheric cerebral infarct. Neuro evaluation in process. MRI pending.     Current medicines are reviewed at length with the patient today.  The patient does not have concerns regarding medicines.  The following changes have been made:  As above  Labs/ tests ordered today include:   No orders of the defined types were placed in this encounter.    Disposition:   FU with Dr. Swaziland in 6 months  Signed, Mikiya Nebergall [place], MD  07/23/2020 1:35 PM

## 2020-07-23 ENCOUNTER — Other Ambulatory Visit: Payer: Self-pay

## 2020-07-23 ENCOUNTER — Encounter: Payer: Self-pay | Admitting: Cardiology

## 2020-07-23 ENCOUNTER — Ambulatory Visit (INDEPENDENT_AMBULATORY_CARE_PROVIDER_SITE_OTHER): Payer: Medicare Other | Admitting: Cardiology

## 2020-07-23 VITALS — BP 98/56 | HR 70 | Ht 68.0 in | Wt 156.0 lb

## 2020-07-23 DIAGNOSIS — I10 Essential (primary) hypertension: Secondary | ICD-10-CM | POA: Diagnosis not present

## 2020-07-23 DIAGNOSIS — I2510 Atherosclerotic heart disease of native coronary artery without angina pectoris: Secondary | ICD-10-CM

## 2020-07-23 DIAGNOSIS — E785 Hyperlipidemia, unspecified: Secondary | ICD-10-CM

## 2020-07-23 DIAGNOSIS — I249 Acute ischemic heart disease, unspecified: Secondary | ICD-10-CM | POA: Diagnosis not present

## 2020-07-23 MED ORDER — ATORVASTATIN CALCIUM 40 MG PO TABS: 40.0000 mg | ORAL_TABLET | Freq: Every day | ORAL | 3 refills | Status: DC

## 2020-07-23 NOTE — Patient Instructions (Signed)
Stop taking amlodipine  Reduce lipitor to 40 mg daily  Follow up in 6 months.

## 2020-07-26 ENCOUNTER — Ambulatory Visit
Admission: RE | Admit: 2020-07-26 | Discharge: 2020-07-26 | Disposition: A | Discharge status: Home / Self Care | Payer: Medicare Other | Source: Ambulatory Visit | Attending: Diagnostic Neuroimaging | Admitting: Diagnostic Neuroimaging

## 2020-07-26 DIAGNOSIS — R200 Anesthesia of skin: Secondary | ICD-10-CM

## 2020-07-26 DIAGNOSIS — R202 Paresthesia of skin: Secondary | ICD-10-CM

## 2020-07-26 MED ORDER — GADOBENATE DIMEGLUMINE 529 MG/ML IV SOLN
15.0000 mL | Freq: Once | INTRAVENOUS | Status: AC | PRN
  Administered 2020-07-26: 15 mL via INTRAVENOUS

## 2020-08-05 ENCOUNTER — Ambulatory Visit: Payer: Medicare Other | Admitting: Cardiology

## 2020-09-02 ENCOUNTER — Other Ambulatory Visit: Payer: Self-pay

## 2020-09-02 ENCOUNTER — Encounter: Payer: Self-pay | Admitting: Family Medicine

## 2020-09-02 ENCOUNTER — Ambulatory Visit (INDEPENDENT_AMBULATORY_CARE_PROVIDER_SITE_OTHER): Payer: Medicare Other | Admitting: Family Medicine

## 2020-09-02 ENCOUNTER — Ambulatory Visit (INDEPENDENT_AMBULATORY_CARE_PROVIDER_SITE_OTHER): Payer: Medicare Other

## 2020-09-02 VITALS — BP 120/84 | HR 60 | Ht 68.0 in | Wt 158.0 lb

## 2020-09-02 DIAGNOSIS — I249 Acute ischemic heart disease, unspecified: Secondary | ICD-10-CM

## 2020-09-02 DIAGNOSIS — M545 Low back pain, unspecified: Secondary | ICD-10-CM

## 2020-09-02 DIAGNOSIS — M25552 Pain in left hip: Secondary | ICD-10-CM

## 2020-09-02 MED ORDER — MELOXICAM 7.5 MG PO TABS
7.5000 mg | ORAL_TABLET | Freq: Every day | ORAL | 0 refills | Status: DC
Start: 2020-09-02 — End: 2020-10-08

## 2020-09-02 MED ORDER — GABAPENTIN 100 MG PO CAPS
200.0000 mg | ORAL_CAPSULE | Freq: Every day | ORAL | 0 refills | Status: DC
Start: 2020-09-02 — End: 2021-01-01

## 2020-09-02 NOTE — Progress Notes (Signed)
[facility] [address] [place] [address] Phone: [phone] Subjective:   Emily Villegas, am serving as a scribe for Dr. Antoine Primas. This visit occurred during the SARS-CoV-2 public health emergency.  Safety protocols were in place, including screening questions prior to the visit, additional usage of staff PPE, and extensive cleaning of exam room while observing appropriate contact time as indicated for disinfecting solutions.   I'm seeing this patient by the request  of:  Sigmund Hazel, MD  CC: left hip pan    [id number]  Emily Villegas is a 64 y.o. female coming in with complaint of left hip pain for the past 3 years. Pain over lateral aspect but feels tightness in the quad and glute. Has not been able to do any leg workouts or pilates due to pain. Deep knee bending increases pain. Sitting improves pain. Denies any radiating symptoms. Does use IBU or Aleve.       Past Medical History:  Diagnosis Date  . Hyperlipidemia   . Hypertension    Past Surgical History:  Procedure Laterality Date  . LEFT HEART CATH AND CORONARY ANGIOGRAPHY N/A 04/21/2020   Procedure: LEFT HEART CATH AND CORONARY ANGIOGRAPHY;  Surgeon: Tonny Bollman, MD;  Location: [facility] INVASIVE CV LAB;  Service: Cardiovascular;  Laterality: N/A;   Social History   Socioeconomic History  . Marital status: Married    Spouse name: Casimiro Needle  . Number of children: Not on file  . Years of education: Not on file  . Highest education level: Master's degree (e.g., MA, MS, MEng, MEd, MSW, MBA)  Occupational History  . Not on file  Tobacco Use  . Smoking status: Never Smoker  . Smokeless tobacco: Never Used  Substance and Sexual Activity  . Alcohol use: Yes    Alcohol/week: 7.0 standard drinks    Types: 7 Glasses of wine per week    Comment: 1-2 drinks most days of the week  . Drug use: No  . Sexual activity: Not on file  Other Topics Concern  . Not on file  Social  History Narrative   Lives with husband   Caffeine- coffee 2 c daily   Social Determinants of Health   Financial Resource Strain:   . Difficulty of Paying Living Expenses: Not on file  Food Insecurity:   . Worried About Programme researcher, broadcasting/film/video in the Last Year: Not on file  . Ran Out of Food in the Last Year: Not on file  Transportation Needs:   . Lack of Transportation (Medical): Not on file  . Lack of Transportation (Non-Medical): Not on file  Physical Activity:   . Days of Exercise per Week: Not on file  . Minutes of Exercise per Session: Not on file  Stress:   . Feeling of Stress : Not on file  Social Connections:   . Frequency of Communication with Friends and Family: Not on file  . Frequency of Social Gatherings with Friends and Family: Not on file  . Attends Religious Services: Not on file  . Active Member of Clubs or Organizations: Not on file  . Attends Banker Meetings: Not on file  . Marital Status: Not on file   No Known Allergies Family History  Problem Relation Age of Onset  . Heart attack Father        late 70s  . Breast cancer Niece 56     Current Outpatient Medications (Cardiovascular):  .  atorvastatin (LIPITOR) 40 MG tablet,  Take 1 tablet (40 mg total) by mouth daily. .  carvedilol (COREG) 12.5 MG tablet, Take 1 tablet (12.5 mg total) by mouth 2 (two) times daily with a meal. .  nitroGLYCERIN (NITROSTAT) 0.4 MG SL tablet, Place 1 tablet (0.4 mg total) under the tongue every 5 (five) minutes as needed for chest pain.   Current Outpatient Medications (Analgesics):  .  aspirin 81 MG chewable tablet, Chew 1 tablet (81 mg total) by mouth daily. .  meloxicam (MOBIC) 7.5 MG tablet, Take 1 tablet (7.5 mg total) by mouth daily.   Current Outpatient Medications (Other):  .  gabapentin (NEURONTIN) 100 MG capsule, Take 2 capsules (200 mg total) by mouth at bedtime.   Reviewed prior external information including notes and imaging from  primary care  provider As well as notes that were available from care everywhere and other healthcare systems.  Past medical history, social, surgical and family history all reviewed in electronic medical record.  No pertanent information unless stated regarding to the chief complaint.   Review of Systems:  No headache, visual changes, nausea, vomiting, diarrhea, constipation, dizziness, abdominal pain, skin rash, fevers, chills, night sweats, weight loss, swollen lymph nodes, body aches, joint swelling, chest pain, shortness of breath, mood changes. POSITIVE muscle aches  Objective  Blood pressure 120/84, pulse 60, height 5\' 8"  (1.727 m), weight 158 lb (71.7 kg), SpO2 99 %.   General: No apparent distress alert and oriented x3 mood and affect normal, dressed appropriately.  HEENT: Pupils equal, extraocular movements intact  Respiratory: Patient's speak in full sentences and does not appear short of breath  Cardiovascular: No lower extremity edema, non tender, no erythema  Neuro: Cranial nerves II through XII are intact, neurovascularly intact in all extremities with 2+ DTRs and 2+ pulses.  Gait normal with good balance and coordination.  MSK: Patient's left hip has significant decrease with only 5 degrees of internal rotation.  Patient also has significant stiffness with external rotation.  Minimal tenderness over the greater trochanteric area.  More pain noted with internal rotation in the groin area.  Patient lacks last 5 degrees of flexion of the hip.  Neurovascularly intact distally with 5-5 strength.     Impression and Recommendations:     The above documentation has been reviewed and is accurate and complete , DO

## 2020-09-02 NOTE — Patient Instructions (Addendum)
Xray today PT [address] Gabapentin 200mg  Meloxicam 7.5mg  Ice 20 min 2x a day Vit D 2000IU  See me in 5-6 weeks

## 2020-09-02 NOTE — Assessment & Plan Note (Signed)
Left hip show significant decrease in internal range of motion and concern for arthritic changes of the hip.  We discussed home exercises, formal physical therapy, small dose of meloxicam.  Patient has had a myocardial infarction in some degree 7.5 mg.  No difficulty with renal.  We discussed how taking it first for 10 days then as needed.  Warned of potential side effects.  Patient also given gabapentin to help with nighttime pain if necessary.  Discussed icing regimen.  Follow-up again in 4 to 6 weeks.

## 2020-09-10 ENCOUNTER — Encounter: Payer: Self-pay | Admitting: Family Medicine

## 2020-09-18 ENCOUNTER — Other Ambulatory Visit: Payer: Self-pay

## 2020-09-18 ENCOUNTER — Encounter: Payer: Self-pay | Admitting: Physical Therapy

## 2020-09-18 ENCOUNTER — Ambulatory Visit: Payer: Medicare Other | Attending: Family Medicine | Admitting: Physical Therapy

## 2020-09-18 DIAGNOSIS — M25652 Stiffness of left hip, not elsewhere classified: Secondary | ICD-10-CM | POA: Diagnosis present

## 2020-09-18 DIAGNOSIS — M25651 Stiffness of right hip, not elsewhere classified: Secondary | ICD-10-CM | POA: Insufficient documentation

## 2020-09-18 DIAGNOSIS — M25552 Pain in left hip: Secondary | ICD-10-CM | POA: Insufficient documentation

## 2020-09-18 NOTE — Therapy (Signed)
[facility] Outpatient Rehabilitation [facility] [address] [place], [place], [address] Phone: [phone]   Fax:  [phone]  Physical Therapy Evaluation  Patient Details  Name: Emily Villegas MRN: [id number] Date of Birth: 1956-06-10 Referring Provider (PT): Dr. Antoine Primas    Encounter Date: 09/18/2020   PT End of Session - 09/18/20 0922    Visit Number 1    Number of Visits 12    Date for PT Re-Evaluation 11/13/20   8 weeks   PT Start Time 0830    PT Stop Time 0925    PT Time Calculation (min) 55 min    Activity Tolerance Patient tolerated treatment well    Behavior During Therapy [facility] for tasks assessed/performed           Past Medical History:  Diagnosis Date   Hyperlipidemia    Hypertension     Past Surgical History:  Procedure Laterality Date   LEFT HEART CATH AND CORONARY ANGIOGRAPHY N/A 04/21/2020   Procedure: LEFT HEART CATH AND CORONARY ANGIOGRAPHY;  Surgeon: Tonny Bollman, MD;  Location: [facility] INVASIVE CV LAB;  Service: Cardiovascular;  Laterality: N/A;    There were no vitals filed for this visit.    Subjective Assessment - 09/18/20 0837    Subjective Pt with chronic L hip and back pain which has been persistent for 2 1/2 years.  Her symptoms are becoming more widespread, move around.  Feels so tight in her L hip.  She has remained active and does report improvement with stretching.  She has noted that sidelying leg lifts make pain increase and her L hip feels very stiff.    Pertinent History MI, HTN    Limitations Lifting;House hold activities;Walking;Other (comment)   exercise, walking on incline   Diagnostic tests XR hip Moderate degenerative joint disease of the left hip, bone spur.  lumbarMultilevel mild to moderate degenerative disc disease and facetarthropathy.    Patient Stated Goals avoid surgery, continue exercising.    Currently in Pain? Yes    Pain Score 1     Pain Location Hip    Pain Orientation  Left;Posterior;Lateral    Pain Descriptors / Indicators Aching;Tightness    Pain Type Chronic pain    Pain Onset More than a month ago    Pain Frequency Intermittent    Aggravating Factors  post exercise, squatting, walking uphill    Pain Relieving Factors total rest    Effect of Pain on Daily Activities want to stay acitve, maintain health without pain              [facility] PT Assessment - 09/18/20 0001      Assessment   Medical Diagnosis L hip pain , lumbar pain     Referring Provider (PT) Dr. Antoine Primas     Onset Date/Surgical Date --   2 .5 yrs    Hand Dominance Right    Prior Therapy No       Precautions   Precautions None      Restrictions   Weight Bearing Restrictions No      Balance Screen   Has the patient fallen in the past 6 months No      Home Environment   Living Environment Private residence    Living Arrangements Spouse/significant other      Prior Function   Level of Independence Independent    Vocation Volunteer work    Leisure travel, exercise       Cognition   Overall Cognitive Status Within  Functional Limits for tasks assessed      Observation/Other Assessments   Focus on Therapeutic Outcomes (FOTO)  62% ability       Sensation   Light Touch Appears Intact      Coordination   Gross Motor Movements are Fluid and Coordinated Not tested      Posture/Postural Control   Posture/Postural Control Postural limitations    Postural Limitations Forward head;Right pelvic obliquity    Posture Comments high L shoulder, Rt pelvis protracted       AROM   Lumbar Flexion WNL    Lumbar Extension WNL    Lumbar - Right Side Bend WNL    Lumbar - Left Side Bend WNL    Lumbar - Right Rotation WNL    Lumbar - Left Rotation WNL       PROM   Overall PROM Comments Rt hip ER 20, IR 15, Lt. hip ER 20, IR 10 deg .  Pain with passive hip flexion (anterior)       Strength   Right Hip Flexion 4/5    Right Hip Extension 4/5    Right Hip ABduction 4+/5    Left  Hip Flexion 4/5    Left Hip Extension 4/5    Left Hip ABduction 4+/5    Right Knee Flexion 5/5    Right Knee Extension 5/5    Left Knee Flexion 5/5    Left Knee Extension 5/5      Flexibility   Hamstrings L more restricted than R     Quadriceps tight bilateral     Piriformis tight, adductors and IR as well       Palpation   Palpation comment pain in L lateral hip: glute, piriformis    Rt lumbar paraspinals more bulky, hypertonic      Special Tests    Special Tests Hip Special Tests    Hip Special Tests  Luisa Hart (FABER) Test;Trendelenberg Test      Luisa Hart [facility]) Test   Findings Positive    Side Left      Trendelenburg Test   Findings Negative    Comments bilateral       Ambulation/Gait   Gait Comments no deviations                       Objective measurements completed on examination: See above findings.       [facility] Adult PT Treatment/Exercise - 09/18/20 0001      Self-Care   Posture asymmetries    Heat/Ice Application ice for hip     Other Self-Care Comments  activity, LE alignment, squat form                   PT Education - 09/18/20 1357    Education Details PT/POC, dry needling, alignment    Person(s) Educated Patient    Methods Explanation;Demonstration    Comprehension Verbalized understanding;Returned demonstration               PT Long Term Goals - 09/18/20 1358      PT LONG TERM GOAL #1   Title Pt will be I with HEP for hip, core without increased pain    Time 6    Period Weeks    Status New    Target Date 10/30/20      PT LONG TERM GOAL #2   Title Pt will be able to report inc increased pain following her normal weight routine at home (30 min ,  8 lb wgt)    Time 6    Period Weeks    Status New    Target Date 10/30/20      PT LONG TERM GOAL #3   Title Pt will be able to walk uphill without increased pain in LLE, hip.    Time 6    Period Weeks    Status New    Target Date 10/30/20      PT LONG TERM GOAL #4    Title Pt will improve FOTO score to 68% to show improved function.    Time 6    Period Weeks    Status New    Target Date 10/30/20                  Plan - 09/18/20 1402    Clinical Impression Statement Patient presents for mod complexity eval of L hip pain.  She has some lumbar DDD shown on XR but back does not limit her since she has been exercising.  She has pain mostly after exercising but knows it will benefit her.  She likely needs some work on her form and alignment to maximize results.  She does have moderate arthritic changes in her hip, including bone spur.  She has anterior hip discomfort and limited bilaterally in hip ROM.  Strength is good. She is open to dry needling to see if she can reduce symptoms and avoid surgery for her L hip.    Personal Factors and Comorbidities Comorbidity 1;Time since onset of injury/illness/exacerbation    Examination-Activity Limitations Squat;Lift;Bend;Carry;Locomotion Level    Examination-Participation Restrictions Interpersonal Relationship;Community Activity;Yard Work;Other   exercise   Stability/Clinical Decision Making Evolving/Moderate complexity    Clinical Decision Making Moderate    Rehab Potential Good    PT Frequency 2x / week    PT Duration 6 weeks    PT Treatment/Interventions ADLs/Self Care Home Management;Cryotherapy;Electrical Stimulation;Iontophoresis 4mg /ml Dexamethasone;Moist Heat;Ultrasound;Functional mobility training;Therapeutic activities;Neuromuscular re-education;Dry needling;Passive range of motion;Orthotic Fit/Training;Manual techniques;Taping;Therapeutic exercise;Patient/family education;Joint Manipulations    PT Next Visit Plan develop full HEP , squat form, hinge  MANUAL, DN    PT Home Exercise Plan none on eval, stretches regularly and does 21 day fix    Consulted and Agree with Plan of Care Patient           Patient will benefit from skilled therapeutic intervention in order to improve the following  deficits and impairments:  Decreased mobility, Hypomobility, Pain, Impaired flexibility, Increased fascial restricitons, Decreased range of motion  Visit Diagnosis: Pain in left hip  Joint stiffness of both hips     Problem List Patient Active Problem List   Diagnosis Date Noted   Left hip pain 09/02/2020   Essential hypertension 04/21/2020   NSTEMI (non-ST elevated myocardial infarction) (HCC) 04/20/2020    Efrata Brunner 09/18/2020, 2:08 PM  [facility] Outpatient Rehabilitation [facility] [address] [place], [place], [place] Phone: [phone]   Fax:  [phone]  Name: Remy Dia MRN: Agustin Cree Date of Birth: 16-Feb-1956   02/14/1956, PT 09/18/20 2:09 PM Phone: [phone] Fax: [phone]

## 2020-09-29 ENCOUNTER — Encounter: Payer: Self-pay | Admitting: *Deleted

## 2020-10-05 ENCOUNTER — Other Ambulatory Visit: Payer: Self-pay

## 2020-10-05 ENCOUNTER — Ambulatory Visit: Payer: Medicare Other | Attending: Family Medicine | Admitting: Physical Therapy

## 2020-10-05 DIAGNOSIS — M25651 Stiffness of right hip, not elsewhere classified: Secondary | ICD-10-CM | POA: Insufficient documentation

## 2020-10-05 DIAGNOSIS — M25652 Stiffness of left hip, not elsewhere classified: Secondary | ICD-10-CM

## 2020-10-05 DIAGNOSIS — M25552 Pain in left hip: Secondary | ICD-10-CM | POA: Insufficient documentation

## 2020-10-05 NOTE — Patient Instructions (Signed)
Access Code: [email or web address]: [email or web address]: 11/08/2021Prepared by: Victorino Dike PaaExercises  Supine Hip Internal and External Rotation - 1 x daily - 7 x weekly - 2 sets - 10 reps - 10 hold  Supine Figure 4 Piriformis Stretch - 1 x daily - 7 x weekly - 1 sets - 10 reps - 30 hold  Supine Figure 4 Piriformis Stretch - 1 x daily - 7 x weekly - 1 sets - 10 reps - 30 hold  Supine Bridge with Resistance Band - 1 x daily - 7 x weekly - 2 sets - 10 reps - 5 hold  Half Kneeling Hip Flexor Stretch - 1 x daily - 7 x weekly - 1 sets - 10 reps - 30 hold  Supine Transversus Abdominis Bracing with Leg Extension - 1 x daily - 7 x weekly - 2 sets - 10 reps - 5 hold

## 2020-10-05 NOTE — Therapy (Signed)
[facility] Outpatient Rehabilitation [facility] [address] [place], [place], [address] Phone: [phone]   Fax:  [phone]  Physical Therapy Treatment  Patient Details  Name: Emily Villegas MRN: [id number] Date of Birth: 12-Dec-1955 Referring Provider (PT): Dr. Antoine Primas    Encounter Date: 10/05/2020   PT End of Session - 10/05/20 1536    Visit Number 2    Number of Visits 12    Date for PT Re-Evaluation 11/13/20    PT Start Time 1530    PT Stop Time 1617    PT Time Calculation (min) 47 min    Activity Tolerance Patient tolerated treatment well    Behavior During Therapy [facility] for tasks assessed/performed           Past Medical History:  Diagnosis Date  . Hyperlipidemia   . Hypertension     Past Surgical History:  Procedure Laterality Date  . LEFT HEART CATH AND CORONARY ANGIOGRAPHY N/A 04/21/2020   Procedure: LEFT HEART CATH AND CORONARY ANGIOGRAPHY;  Surgeon: Tonny Bollman, MD;  Location: [facility] INVASIVE CV LAB;  Service: Cardiovascular;  Laterality: N/A;    There were no vitals filed for this visit.   Subjective Assessment - 10/05/20 1534    Subjective Did yoga and walked today.  The medicine is working.    Currently in Pain? Yes    Pain Score 1               [facility] Adult PT Treatment/Exercise - 10/05/20 0001      Self-Care   Posture options for stretching     Other Self-Care Comments  HEP, dry needling, bone spur, options for treatment       Knee/Hip Exercises: Stretches   Active Hamstring Stretch Left;2 reps    Hip Flexor Stretch Left;60 seconds    Hip Flexor Stretch Limitations edge of mat table     ITB Stretch Left;2 reps    Piriformis Stretch Left;5 reps    Piriformis Stretch Limitations knee pull, press and fig 4    Other Knee/Hip Stretches IR of hips (lower trunk rotation with wide knees)       Manual Therapy   Manual Therapy Soft tissue mobilization;Myofascial release;Passive ROM;Manual Traction    Manual therapy  comments anterior capsule stretch in prone     Soft tissue mobilization L anterolateral hip , also posterior hip in prone     Myofascial Release L trunk , pelvis     Passive ROM Lt hip in prone     Manual Traction LLE LAD x 5                 PT Education - 10/05/20 1646    Education Details HEP    Person(s) Educated Patient    Methods Explanation;Demonstration;Handout;Verbal cues    Comprehension Verbalized understanding;Returned demonstration               PT Long Term Goals - 10/05/20 1642      PT LONG TERM GOAL #1   Title Pt will be I with HEP for hip, core without increased pain    Status On-going      PT LONG TERM GOAL #2   Title Pt will be able to report inc increased pain following her normal weight routine at home (30 min , 8 lb wgt)    Status On-going      PT LONG TERM GOAL #3   Title Pt will be able to walk uphill without increased pain in LLE,  hip.    Status On-going      PT LONG TERM GOAL #4   Title Pt will improve FOTO score to 68% to show improved function.    Status On-going                 Plan - 10/05/20 1642    Clinical Impression Statement Patient here for 1st appt today.  She was given a full HEP to address L hip ROM and lower abdominal strength.  She continues to have pain with hip flexion and ER/IR.  Provided stretching to tolerance and assurance she will benefit from it. Manual therapy to L hip provided some benefit, including IASTM.    PT Treatment/Interventions ADLs/Self Care Home Management;Cryotherapy;Electrical Stimulation;Iontophoresis 4mg /ml Dexamethasone;Moist Heat;Ultrasound;Functional mobility training;Therapeutic activities;Neuromuscular re-education;Dry needling;Passive range of motion;Orthotic Fit/Training;Manual techniques;Taping;Therapeutic exercise;Patient/family education;Joint Manipulations    PT Next Visit Plan review HEP as needed.  Consider manual (ant capsule) and DN if agreeable.    PT Home Exercise Plan            Patient will benefit from skilled therapeutic intervention in order to improve the following deficits and impairments:  Decreased mobility, Hypomobility, Pain, Impaired flexibility, Increased fascial restricitons, Decreased range of motion  Visit Diagnosis: Pain in left hip  Joint stiffness of both hips     Problem List Patient Active Problem List   Diagnosis Date Noted  . Left hip pain 09/02/2020  . Essential hypertension 04/21/2020  . NSTEMI (non-ST elevated myocardial infarction) (HCC) 04/20/2020    Laporsche Hoeger 10/05/2020, 4:53 PM  [facility] [address] [place], [place], [place] Phone: [phone]   Fax:  [phone]  Name: Emily Villegas MRN: Agustin Cree Date of Birth: 1956-03-08  02/14/1956, PT 10/05/20 4:53 PM Phone: [phone] Fax: [phone]

## 2020-10-07 NOTE — Progress Notes (Signed)
[name] [address] [place] [address] Phone: [phone] Subjective:   I Emily Villegas am serving as a Neurosurgeon for Dr. Antoine Primas.  This visit occurred during the SARS-CoV-2 public health emergency.  Safety protocols were in place, including screening questions prior to the visit, additional usage of staff PPE, and extensive cleaning of exam room while observing appropriate contact time as indicated for disinfecting solutions.   I'm seeing this patient by the request  of:  Sigmund Hazel, MD  CC: Left hip  [id number]   09/02/2020 Left hip show significant decrease in internal range of motion and concern for arthritic changes of the hip.  We discussed home exercises, formal physical therapy, small dose of meloxicam.  Patient has had a myocardial infarction in some degree.  No difficulty with renal.  We discussed how taking it first for 10 days then as needed.  Warned of potential side effects.  Patient also given gabapentin to help with nighttime pain if necessary.  Discussed icing regimen.  Follow-up again in 4 to 6 weeks.    Update 10/08/2020 Emily Villegas is a 64 y.o. female coming in with complaint of left hip pain. Patient states she is feeling ok. Hard to tell if she is making progress.  Patient states that the meloxicam has been significantly beneficial.  Not taking the gabapentin.  Patient states that doing the exercises have been helpful.  Has only been to physical therapy 1 time but is excited at the possibility of improving.   Patient did have x-rays at last exam and was found to have moderate to severe arthritic changes of the left hip. Seems to be more secondary to an osteophyte formation no joint space narrowing noted. Patient also had x-rays of the lumbar spine showing moderate degenerative disc disease at L3-S1  Patient has been in formal physical therapy.  Past Medical History:  Diagnosis Date  . Hyperlipidemia   .  Hypertension    Past Surgical History:  Procedure Laterality Date  . LEFT HEART CATH AND CORONARY ANGIOGRAPHY N/A 04/21/2020   Procedure: LEFT HEART CATH AND CORONARY ANGIOGRAPHY;  Surgeon: Tonny Bollman, MD;  Location: [facility] INVASIVE CV LAB;  Service: Cardiovascular;  Laterality: N/A;   Social History   Socioeconomic History  . Marital status: Married    Spouse name: Emily Villegas  . Number of children: Not on file  . Years of education: Not on file  . Highest education level: Master's degree (e.g., MA, MS, MEng, MEd, MSW, MBA)  Occupational History  . Not on file  Tobacco Use  . Smoking status: Never Smoker  . Smokeless tobacco: Never Used  Substance and Sexual Activity  . Alcohol use: Yes    Alcohol/week: 7.0 standard drinks    Types: 7 Glasses of wine per week    Comment: 1-2 drinks most days of the week  . Drug use: No  . Sexual activity: Not on file  Other Topics Concern  . Not on file  Social History Narrative   Lives with husband   Caffeine- coffee 2 c daily   Social Determinants of Health   Financial Resource Strain:   . Difficulty of Paying Living Expenses: Not on file  Food Insecurity:   . Worried About Programme researcher, broadcasting/film/video in the Last Year: Not on file  . Ran Out of Food in the Last Year: Not on file  Transportation Needs:   . Lack of Transportation (Medical): Not on file  . Lack  of Transportation (Non-Medical): Not on file  Physical Activity:   . Days of Exercise per Week: Not on file  . Minutes of Exercise per Session: Not on file  Stress:   . Feeling of Stress : Not on file  Social Connections:   . Frequency of Communication with Friends and Family: Not on file  . Frequency of Social Gatherings with Friends and Family: Not on file  . Attends Religious Services: Not on file  . Active Member of Clubs or Organizations: Not on file  . Attends Banker Meetings: Not on file  . Marital Status: Not on file   No Known Allergies Family History    Problem Relation Age of Onset  . Heart attack Father        late 57s  . Breast cancer Niece 37     Current Outpatient Medications (Cardiovascular):  .  atorvastatin (LIPITOR) 40 MG tablet, Take 1 tablet (40 mg total) by mouth daily. .  carvedilol (COREG) 12.5 MG tablet, Take 1 tablet (12.5 mg total) by mouth 2 (two) times daily with a meal. .  nitroGLYCERIN (NITROSTAT) 0.4 MG SL tablet, Place 1 tablet (0.4 mg total) under the tongue every 5 (five) minutes as needed for chest pain.   Current Outpatient Medications (Analgesics):  .  aspirin 81 MG chewable tablet, Chew 1 tablet (81 mg total) by mouth daily. .  meloxicam (MOBIC) 7.5 MG tablet, Take 1 tablet (7.5 mg total) by mouth daily.   Current Outpatient Medications (Other):  .  gabapentin (NEURONTIN) 100 MG capsule, Take 2 capsules (200 mg total) by mouth at bedtime.   Reviewed prior external information including notes and imaging from  primary care provider As well as notes that were available from care everywhere and other healthcare systems.  Past medical history, social, surgical and family history all reviewed in electronic medical record.  No pertanent information unless stated regarding to the chief complaint.   Review of Systems:  No headache, visual changes, nausea, vomiting, diarrhea, constipation, dizziness, abdominal pain, skin rash, fevers, chills, night sweats, weight loss, swollen lymph nodes, body aches, joint swelling, chest pain, shortness of breath, mood changes. POSITIVE muscle aches  Objective  Blood pressure (!) 150/90, pulse 69, height 5\' 8"  (1.727 m), weight 159 lb (72.1 kg), SpO2 98 %.   General: No apparent distress alert and oriented x3 mood and affect normal, dressed appropriately.  HEENT: Pupils equal, extraocular movements intact  Respiratory: Patient's speak in full sentences and does not appear short of breath  Cardiovascular: No lower extremity edema, non tender, no erythema  Very minorly  antalgic Patient's left hip still has a less than 5 degrees of internal rotation.  Patient has more pain with external rotation and only has 25 degrees.  Neurovascularly intact distally.  Minimal pain in the back and the lumbar spine mostly left side greater than right.    Impression and Recommendations:     The above documentation has been reviewed and is accurate and complete , DO

## 2020-10-08 ENCOUNTER — Other Ambulatory Visit: Payer: Self-pay

## 2020-10-08 ENCOUNTER — Ambulatory Visit (INDEPENDENT_AMBULATORY_CARE_PROVIDER_SITE_OTHER): Payer: Medicare Other | Admitting: Family Medicine

## 2020-10-08 ENCOUNTER — Encounter: Payer: Self-pay | Admitting: Family Medicine

## 2020-10-08 DIAGNOSIS — M25552 Pain in left hip: Secondary | ICD-10-CM | POA: Diagnosis not present

## 2020-10-08 DIAGNOSIS — I249 Acute ischemic heart disease, unspecified: Secondary | ICD-10-CM | POA: Diagnosis not present

## 2020-10-08 MED ORDER — MELOXICAM 7.5 MG PO TABS: 7.5000 mg | ORAL_TABLET | Freq: Every day | ORAL | 1 refills | Status: DC

## 2020-10-08 NOTE — Assessment & Plan Note (Signed)
Patient does have more of the left hip pain with underlying arthritic changes.  Patient bone does have more of a osteophyte that is likely contributing.  I do believe that physical therapy could be beneficial and help slow down progression and help with the pain control.  Patient is responding well to the meloxicam will send message to cardiology to make sure that they are fine with this being longstanding with patient's recent cardiac history.  We will be doing it in 5-day burst when patient is having pain.  Patient is able to do daily activities without any significant discomfort at this time and will follow up with me again in 2 to 3 months

## 2020-10-08 NOTE — Patient Instructions (Addendum)
Good to see you Refilled the meloxicam but lets try take it for 5 days at a time then stop for as long as you can, if you feel like you need it again then do it for another 5 days  Work with physical therapy  See me again in 2 months

## 2020-10-09 ENCOUNTER — Ambulatory Visit: Payer: Medicare Other | Admitting: Physical Therapy

## 2020-10-09 ENCOUNTER — Other Ambulatory Visit: Payer: Self-pay

## 2020-10-09 ENCOUNTER — Encounter: Payer: Self-pay | Admitting: Physical Therapy

## 2020-10-09 DIAGNOSIS — M25652 Stiffness of left hip, not elsewhere classified: Secondary | ICD-10-CM

## 2020-10-09 DIAGNOSIS — M25552 Pain in left hip: Secondary | ICD-10-CM

## 2020-10-09 DIAGNOSIS — M25651 Stiffness of right hip, not elsewhere classified: Secondary | ICD-10-CM

## 2020-10-09 NOTE — Therapy (Signed)
[facility] Outpatient Rehabilitation [facility] [address] [place], [place], [address] Phone: [phone]   Fax:  [phone]  Physical Therapy Treatment  Patient Details  Name: Emily Villegas MRN: [id number] Date of Birth: 09/21/1956 Referring Provider (PT): Dr. Antoine Primas    Encounter Date: 10/09/2020   PT End of Session - 10/09/20 0938    Visit Number 3    Number of Visits 12    Date for PT Re-Evaluation 11/13/20    PT Start Time 0846    PT Stop Time 0928    PT Time Calculation (min) 42 min    Activity Tolerance Patient tolerated treatment well    Behavior During Therapy [facility] for tasks assessed/performed           Past Medical History:  Diagnosis Date  . Hyperlipidemia   . Hypertension     Past Surgical History:  Procedure Laterality Date  . LEFT HEART CATH AND CORONARY ANGIOGRAPHY N/A 04/21/2020   Procedure: LEFT HEART CATH AND CORONARY ANGIOGRAPHY;  Surgeon: Tonny Bollman, MD;  Location: [facility] INVASIVE CV LAB;  Service: Cardiovascular;  Laterality: N/A;    There were no vitals filed for this visit.   Subjective Assessment - 10/09/20 0853    Subjective Patient reports her hip was sore this morning. She reports that when she does a long walk or work out she can be stiff or sore for a day.    Limitations Lifting;House hold activities;Walking;Other (comment)    Diagnostic tests XR hip Moderate degenerative joint disease of the left hip, bone spur.  lumbarMultilevel mild to moderate degenerative disc disease and facetarthropathy.    Patient Stated Goals avoid surgery, continue exercising.    Currently in Pain? No/denies   just stiffness this morning                            [facility] Adult PT Treatment/Exercise - 10/09/20 0001      Knee/Hip Exercises: Stretches   Piriformis Stretch Left;5 reps    Piriformis Stretch Limitations knee pull, press and fig 4      Knee/Hip Exercises: Standing   Other Standing Knee Exercises  reviewed use of the tennis ball for light trigger point release. Patient had been using the ball at home.       Knee/Hip Exercises: Supine   Bridges Limitations 2x15     Other Supine Knee/Hip Exercises supine clamshell 2x20       Manual Therapy   Manual Therapy Joint mobilization    Joint Mobilization Posterior glide Grade 4     Manual Traction LLE LAD x 5             Trigger Point Dry Needling - 10/09/20 0001    Consent Given? Yes    Education Handout Provided Yes    Muscles Treated Back/Hip Gluteus minimus;Gluteus maximus    Other Dry Needling 4 needles perfromed the last getting the best twitch     Gluteus Minimus Response Twitch response elicited;Palpable increased muscle length    Gluteus Maximus Response Twitch response elicited;Palpable increased muscle length                PT Education - 10/09/20 0856    Education Details benefits and risks of TPDN    Person(s) Educated Patient    Methods Explanation;Demonstration;Tactile cues;Verbal cues    Comprehension Verbalized understanding;Returned demonstration;Verbal cues required               PT  Long Term Goals - 10/05/20 1642      PT LONG TERM GOAL #1   Title Pt will be I with HEP for hip, core without increased pain    Status On-going      PT LONG TERM GOAL #2   Title Pt will be able to report inc increased pain following her normal weight routine at home (30 min , 8 lb wgt)    Status On-going      PT LONG TERM GOAL #3   Title Pt will be able to walk uphill without increased pain in LLE, hip.    Status On-going      PT LONG TERM GOAL #4   Title Pt will improve FOTO score to 68% to show improved function.    Status On-going                 Plan - 10/09/20 7510    Clinical Impression Statement Patient had a good twitch respose with her last needle. Therapy reviewed post needle soreness and exercises to relieve soreness. She tolerated treatment well. She did report some pain folling light LAD  but no pain wiht posterior glifdes of the hip. Therapy will continue to work on ROM and strengthening as tolerated. Continue needling if beneficial and consider needling deep rotators.    Personal Factors and Comorbidities Comorbidity 1;Time since onset of injury/illness/exacerbation    Examination-Activity Limitations Squat;Lift;Bend;Carry;Locomotion Level    Examination-Participation Restrictions Interpersonal Relationship;Community Activity;Yard Work;Other    Stability/Clinical Decision Making Evolving/Moderate complexity    Clinical Decision Making Moderate    Rehab Potential Good    PT Frequency 2x / week    PT Duration 6 weeks    PT Treatment/Interventions ADLs/Self Care Home Management;Cryotherapy;Electrical Stimulation;Iontophoresis 4mg /ml Dexamethasone;Moist Heat;Ultrasound;Functional mobility training;Therapeutic activities;Neuromuscular re-education;Dry needling;Passive range of motion;Orthotic Fit/Training;Manual techniques;Taping;Therapeutic exercise;Patient/family education;Joint Manipulations    PT Next Visit Plan review HEP as needed.  Consider manual (ant capsule) and DN if agreeable.    PT Home Exercise Plan    Consulted and Agree with Plan of Care Patient           Patient will benefit from skilled therapeutic intervention in order to improve the following deficits and impairments:  Decreased mobility, Hypomobility, Pain, Impaired flexibility, Increased fascial restricitons, Decreased range of motion  Visit Diagnosis: Pain in left hip  Joint stiffness of both hips     Problem List Patient Active Problem List   Diagnosis Date Noted  . Left hip pain 09/02/2020  . Essential hypertension 04/21/2020  . NSTEMI (non-ST elevated myocardial infarction) (HCC) 04/20/2020    04/22/2020 PT DPT  10/09/2020, 9:44 AM  [facility] [address] [place], [place], [place] Phone: [phone]   Fax:   [phone]  Name: Emily Villegas MRN: Agustin Cree Date of Birth: 02-20-1956

## 2020-10-12 ENCOUNTER — Other Ambulatory Visit: Payer: Self-pay

## 2020-10-12 ENCOUNTER — Encounter: Payer: Self-pay | Admitting: Physical Therapy

## 2020-10-12 ENCOUNTER — Ambulatory Visit: Payer: Medicare Other | Admitting: Physical Therapy

## 2020-10-12 DIAGNOSIS — M25552 Pain in left hip: Secondary | ICD-10-CM | POA: Diagnosis not present

## 2020-10-12 DIAGNOSIS — M25651 Stiffness of right hip, not elsewhere classified: Secondary | ICD-10-CM

## 2020-10-12 DIAGNOSIS — M25652 Stiffness of left hip, not elsewhere classified: Secondary | ICD-10-CM

## 2020-10-12 NOTE — Therapy (Signed)
[facility] Outpatient Rehabilitation [facility] [address] [place], [place], [address] Phone: [phone]   Fax:  [phone]  Physical Therapy Treatment  Patient Details  Name: Emily Villegas MRN: [id number] Date of Birth: 29-Dec-1955 Referring Provider (PT): Dr. Antoine Primas    Encounter Date: 10/12/2020   PT End of Session - 10/12/20 0853    Visit Number 4    Number of Visits 12    Date for PT Re-Evaluation 11/13/20    PT Start Time 0847    PT Stop Time 0930    PT Time Calculation (min) 43 min    Activity Tolerance Patient tolerated treatment well    Behavior During Therapy [facility] for tasks assessed/performed           Past Medical History:  Diagnosis Date  . Hyperlipidemia   . Hypertension     Past Surgical History:  Procedure Laterality Date  . LEFT HEART CATH AND CORONARY ANGIOGRAPHY N/A 04/21/2020   Procedure: LEFT HEART CATH AND CORONARY ANGIOGRAPHY;  Surgeon: Tonny Bollman, MD;  Location: [facility] INVASIVE CV LAB;  Service: Cardiovascular;  Laterality: N/A;    There were no vitals filed for this visit.   Subjective Assessment - 10/12/20 0852    Subjective Patient rpeorts she felt it over the weekend but it didn't stop her from doing her activity. She is not having pain this morning. She had no significant pain from the needling.    Limitations Lifting;House hold activities;Walking;Other (comment)    Diagnostic tests XR hip Moderate degenerative joint disease of the left hip, bone spur.  lumbarMultilevel mild to moderate degenerative disc disease and facetarthropathy.    Patient Stated Goals avoid surgery, continue exercising.    Currently in Pain? No/denies                             [facility] Adult PT Treatment/Exercise - 10/12/20 0001      Knee/Hip Exercises: Stretches   Piriformis Stretch Left;5 reps    Piriformis Stretch Limitations knee pull, press and fig 4      Knee/Hip Exercises: Standing   Hip Flexion Limitations  slow standing knee flexion x15     Hip ADduction Limitations standing 2x10     Extension Limitations standing 2x10 bilateral       Knee/Hip Exercises: Supine   Other Supine Knee/Hip Exercises supine clamshell 2x20       Manual Therapy   Manual Therapy Joint mobilization    Joint Mobilization Posterior glide Grade 4     Manual Traction LLE LAD x 5             Trigger Point Dry Needling - 10/12/20 0001    Consent Given? Yes    Education Handout Provided Yes    Muscles Treated Back/Hip Piriformis    Other Dry Needling 3 spots in the piriformis     Piriformis Response Twitch response elicited                PT Education - 10/12/20 0853    Education Details reviewed ther-ex and benefits/risk of TPDN    Person(s) Educated Patient    Methods Explanation;Demonstration;Tactile cues;Verbal cues    Comprehension Verbalized understanding;Returned demonstration;Verbal cues required;Tactile cues required               PT Long Term Goals - 10/05/20 1642      PT LONG TERM GOAL #1   Title Pt will be I with HEP  for hip, core without increased pain    Status On-going      PT LONG TERM GOAL #2   Title Pt will be able to report inc increased pain following her normal weight routine at home (30 min , 8 lb wgt)    Status On-going      PT LONG TERM GOAL #3   Title Pt will be able to walk uphill without increased pain in LLE, hip.    Status On-going      PT LONG TERM GOAL #4   Title Pt will improve FOTO score to 68% to show improved function.    Status On-going                 Plan - 10/12/20 1006    Clinical Impression Statement Therapy needled her piriformis. We continued to work on manual therapy including joint mobilizations. She was given a standing  series of ther-ex. Overall her hip is ablut the same at this time. She will continue to work on stretching and ther-ex. She will reduce her frequency to 1x a week.    Personal Factors and Comorbidities Comorbidity  1;Time since onset of injury/illness/exacerbation    Examination-Activity Limitations Squat;Lift;Bend;Carry;Locomotion Level    Examination-Participation Restrictions Interpersonal Relationship;Community Activity;Yard Work;Other    Stability/Clinical Decision Making Evolving/Moderate complexity    Clinical Decision Making Moderate    Rehab Potential Good    PT Frequency 2x / week    PT Duration 6 weeks    PT Treatment/Interventions ADLs/Self Care Home Management;Cryotherapy;Electrical Stimulation;Iontophoresis 4mg /ml Dexamethasone;Moist Heat;Ultrasound;Functional mobility training;Therapeutic activities;Neuromuscular re-education;Dry needling;Passive range of motion;Orthotic Fit/Training;Manual techniques;Taping;Therapeutic exercise;Patient/family education;Joint Manipulations    PT Next Visit Plan review HEP as needed.  Consider manual (ant capsule) and DN if agreeable.    PT Home Exercise Plan    Consulted and Agree with Plan of Care Patient           Patient will benefit from skilled therapeutic intervention in order to improve the following deficits and impairments:  Decreased mobility, Hypomobility, Pain, Impaired flexibility, Increased fascial restricitons, Decreased range of motion  Visit Diagnosis: Pain in left hip  Joint stiffness of both hips     Problem List Patient Active Problem List   Diagnosis Date Noted  . Left hip pain 09/02/2020  . Essential hypertension 04/21/2020  . NSTEMI (non-ST elevated myocardial infarction) (HCC) 04/20/2020    04/22/2020  PT DPT  10/12/2020, 10:13 AM  [facility] [address] [place], [place], [place] Phone: [phone]   Fax:  [phone]  Name: Emily Villegas MRN: Agustin Cree Date of Birth: 1956/09/26

## 2020-10-16 ENCOUNTER — Encounter: Payer: Medicare Other | Admitting: Physical Therapy

## 2020-10-19 ENCOUNTER — Ambulatory Visit: Payer: Medicare Other | Admitting: Physical Therapy

## 2020-10-19 ENCOUNTER — Other Ambulatory Visit: Payer: Self-pay

## 2020-10-19 ENCOUNTER — Encounter: Payer: Self-pay | Admitting: Physical Therapy

## 2020-10-19 DIAGNOSIS — M25552 Pain in left hip: Secondary | ICD-10-CM | POA: Diagnosis not present

## 2020-10-19 DIAGNOSIS — M25651 Stiffness of right hip, not elsewhere classified: Secondary | ICD-10-CM

## 2020-10-19 DIAGNOSIS — M25652 Stiffness of left hip, not elsewhere classified: Secondary | ICD-10-CM

## 2020-10-19 NOTE — Therapy (Signed)
[place], [place], [address] Phone: [phone]   Fax:  [phone]  Physical Therapy Treatment/Discharge   Patient Details  Name: Emily Villegas MRN: [id number] Date of Birth: 1956-02-27 Referring Provider (PT): Dr. Hulan Saas    Encounter Date: 10/19/2020   PT End of Session - 10/19/20 0959    Visit Number 5    Number of Visits 12    Date for PT Re-Evaluation 11/13/20    PT Start Time 0845    PT Stop Time 0923    PT Time Calculation (min) 38 min    Activity Tolerance Patient tolerated treatment well    Behavior During Therapy [facility] for tasks assessed/performed           Past Medical History:  Diagnosis Date   Hyperlipidemia    Hypertension     Past Surgical History:  Procedure Laterality Date   LEFT HEART CATH AND CORONARY ANGIOGRAPHY N/A 04/21/2020   Procedure: LEFT HEART CATH AND CORONARY ANGIOGRAPHY;  Surgeon: Sherren Mocha, MD;  Location: [place] [facility];  Service: Cardiovascular;  Laterality: N/A;    There were no vitals filed for this visit.   Subjective Assessment - 10/19/20 1016    Subjective Patient reports no real difference. She is apinful when she does activity. She does not feel the needling or the manual therapy has made musch of a difference. she feels like she can continu on her own at home.    Limitations Lifting;House hold activities;Walking;Other (comment)    Diagnostic tests XR hip Moderate degenerative joint disease of the left hip, bone spur.  lumbarMultilevel mild to moderate degenerative disc disease and facetarthropathy.    Patient Stated Goals avoid surgery, continue exercising.    Currently in Pain? No/denies   pain with activity                            [facility] Adult PT Treatment/Exercise - 10/19/20 0001      Self-Care   Other Self-Care Comments  reviewed how to use execise prgram at home. Activity's to avoid if she is having  pain. Advised to keep active.       Knee/Hip Exercises: Stretches   Piriformis Stretch Limitations reviewed. Having pinching with glute stretch. Patient not advised to do if it hurts      Knee/Hip Exercises: Standing   Other Standing Knee Exercises side stepping with band red 3x20 each direction; monster walk 2x10 tred band       Manual Therapy   Joint Mobilization Posterior glide Grade 4. Patient reported improved pinching. Advised how her husband can do at home.     Manual Traction LLE LAD x 5                   PT Education - 10/19/20 1048    Education Details reviewed final HEP    Person(s) Educated Patient    Methods Explanation;Demonstration;Verbal cues;Tactile cues    Comprehension Returned demonstration;Verbalized understanding;Verbal cues required;Tactile cues required               PT Long Term Goals - 10/19/20 0950      PT LONG TERM GOAL #1   Title Pt will be I with HEP for hip, core without increased pain    Baseline perfroming at home    Time 6    Period Weeks    Status On-going      PT LONG  TERM GOAL #2   Title Pt will be able to report inc increased pain following her normal weight routine at home (30 min , 8 lb wgt)    Baseline still limited    Time 6    Period Weeks    Status Not Met      PT LONG TERM GOAL #3   Title Pt will be able to walk uphill without increased pain in LLE, hip.    Baseline pain at times    Time 6    Period Weeks    Status Not Met      PT LONG TERM GOAL #4   Title Pt will improve FOTO score to 68% to show improved function.    Baseline 31% limitation 79% function    Time 6    Period Weeks    Status Achieved                 Plan - 10/19/20 0949    Clinical Impression Statement At this time thepatient feels like she can do her exercises at home. She has tried needling and manual therapy and has noticed very little change. She has some stretches that help and some that dont. She has gotten the most benefit  from Yoga. She was advised to continue with activity that dosen't vcause significant pain. She met her FOTO goal. She Will follow up with the MD in January.    Personal Factors and Comorbidities Comorbidity 1;Time since onset of injury/illness/exacerbation    Examination-Activity Limitations Squat;Lift;Bend;Carry;Locomotion Level    Examination-Participation Restrictions Interpersonal Relationship;Community Activity;Yard Work;Other    Stability/Clinical Decision Making Evolving/Moderate complexity    Clinical Decision Making Moderate    Rehab Potential Good    PT Frequency 2x / week    PT Duration 6 weeks    PT Treatment/Interventions ADLs/Self Care Home Management;Cryotherapy;Electrical Stimulation;Iontophoresis 5m/ml Dexamethasone;Moist Heat;Ultrasound;Functional mobility training;Therapeutic activities;Neuromuscular re-education;Dry needling;Passive range of motion;Orthotic Fit/Training;Manual techniques;Taping;Therapeutic exercise;Patient/family education;Joint Manipulations    PT Next Visit Plan review HEP as needed.  Consider manual (ant capsule) and DN if agreeable.    PT Home Exercise Plan [id number]   Consulted and Agree with Plan of Care Patient           Patient will benefit from skilled therapeutic intervention in order to improve the following deficits and impairments:  Decreased mobility, Hypomobility, Pain, Impaired flexibility, Increased fascial restricitons, Decreased range of motion  Visit Diagnosis: Pain in left hip  Joint stiffness of both hips  PHYSICAL THERAPY DISCHARGE SUMMARY  Visits from Start of Care: 5  Current functional level related to goals / functional outcomes: Able to do some types of exercises but not thers    Remaining deficits: Continue positional and activity related pain    Education / Equipment: HEP  Plan: Patient agrees to discharge.  Patient goals were met. Patient is being discharged due to meeting the stated rehab goals.  ?????        Problem List Patient Active Problem List   Diagnosis Date Noted   Left hip pain 09/02/2020   Essential hypertension 04/21/2020   NSTEMI (non-ST elevated myocardial infarction) ([facility] 04/20/2020    DCarney Living11/22/2021, 12:28 PM  CL[facility] [address]Phone: [phone]  Fax:  [phone] Name: PAolanis CrispenMRN: [id number] of Birth: 314-Nov-1957

## 2020-10-26 ENCOUNTER — Ambulatory Visit: Payer: Medicare Other | Admitting: Physical Therapy

## 2020-10-30 ENCOUNTER — Encounter: Payer: Medicare Other | Admitting: Physical Therapy

## 2020-11-02 ENCOUNTER — Encounter: Payer: Medicare Other | Admitting: Physical Therapy

## 2020-11-05 ENCOUNTER — Encounter: Payer: Medicare Other | Admitting: Physical Therapy

## 2020-11-06 ENCOUNTER — Encounter: Payer: Medicare Other | Admitting: Physical Therapy

## 2020-12-10 ENCOUNTER — Ambulatory Visit: Payer: Medicare Other | Admitting: Family Medicine

## 2020-12-10 NOTE — Progress Notes (Deleted)
[facility] [address] [place] [address] Phone: [phone] Subjective:    I'm seeing this patient by the request  of:  Sigmund Hazel, MD  CC:   [id number]   10/08/2020 Patient does have more of the left hip pain with underlying arthritic changes.  Patient bone does have more of a osteophyte that is likely contributing.  I do believe that physical therapy could be beneficial and help slow down progression and help with the pain control.  Patient is responding well to the meloxicam will send message to cardiology to make sure that they are fine with this being longstanding with patient's recent cardiac history.  We will be doing it in 5-day burst when patient is having pain.  Patient is able to do daily activities without any significant discomfort at this time and will follow up with me again in 2 to 3 months  Update 12/10/2020 Emily Villegas is a 65 y.o. female coming in with complaint of left hip pain. Patient has been doing physical therapy. Patient states       Past Medical History:  Diagnosis Date  . Hyperlipidemia   . Hypertension    Past Surgical History:  Procedure Laterality Date  . LEFT HEART CATH AND CORONARY ANGIOGRAPHY N/A 04/21/2020   Procedure: LEFT HEART CATH AND CORONARY ANGIOGRAPHY;  Surgeon: Tonny Bollman, MD;  Location: [facility];  Service: Cardiovascular;  Laterality: N/A;   Social History   Socioeconomic History  . Marital status: Married    Spouse name: Casimiro Needle  . Number of children: Not on file  . Years of education: Not on file  . Highest education level: Master's degree (e.g., MA, MS, MEng, MEd, MSW, MBA)  Occupational History  . Not on file  Tobacco Use  . Smoking status: Never Smoker  . Smokeless tobacco: Never Used  Substance and Sexual Activity  . Alcohol use: Yes    Alcohol/week: 7.0 standard drinks    Types: 7 Glasses of wine per week    Comment: 1-2 drinks most days of the week  .  Drug use: No  . Sexual activity: Not on file  Other Topics Concern  . Not on file  Social History Narrative   Lives with husband   Caffeine- coffee 2 c daily   Social Determinants of Health   Financial Resource Strain: Not on file  Food Insecurity: Not on file  Transportation Needs: Not on file  Physical Activity: Not on file  Stress: Not on file  Social Connections: Not on file   No Known Allergies Family History  Problem Relation Age of Onset  . Heart attack Father        late 54s  . Breast cancer Niece 10     Current Outpatient Medications (Cardiovascular):  .  atorvastatin (LIPITOR) 40 MG tablet, Take 1 tablet (40 mg total) by mouth daily. .  carvedilol (COREG) 12.5 MG tablet, Take 1 tablet (12.5 mg total) by mouth 2 (two) times daily with a meal. .  nitroGLYCERIN (NITROSTAT) 0.4 MG SL tablet, Place 1 tablet (0.4 mg total) under the tongue every 5 (five) minutes as needed for chest pain.   Current Outpatient Medications (Analgesics):  .  aspirin 81 MG chewable tablet, Chew 1 tablet (81 mg total) by mouth daily. .  meloxicam (MOBIC) 7.5 MG tablet, Take 1 tablet (7.5 mg total) by mouth daily.   Current Outpatient Medications (Other):  .  gabapentin (NEURONTIN) 100 MG capsule, Take 2  capsules (200 mg total) by mouth at bedtime.   Reviewed prior external information including notes and imaging from  primary care provider As well as notes that were available from care everywhere and other healthcare systems.  Past medical history, social, surgical and family history all reviewed in electronic medical record.  No pertanent information unless stated regarding to the chief complaint.   Review of Systems:  No headache, visual changes, nausea, vomiting, diarrhea, constipation, dizziness, abdominal pain, skin rash, fevers, chills, night sweats, weight loss, swollen lymph nodes, body aches, joint swelling, chest pain, shortness of breath, mood changes. POSITIVE muscle  aches  Objective  There were no vitals taken for this visit.   General: No apparent distress alert and oriented x3 mood and affect normal, dressed appropriately.  HEENT: Pupils equal, extraocular movements intact  Respiratory: Patient's speak in full sentences and does not appear short of breath  Cardiovascular: No lower extremity edema, non tender, no erythema  Neuro: Cranial nerves II through XII are intact, neurovascularly intact in all extremities with 2+ DTRs and 2+ pulses.  Gait normal with good balance and coordination.  MSK:  Non tender with full range of motion and good stability and symmetric strength and tone of shoulders, elbows, wrist, hip, knee and ankles bilaterally.     Impression and Recommendations:     The above documentation has been reviewed and is accurate and complete Wilford Grist

## 2020-12-28 NOTE — Progress Notes (Unsigned)
Cardiology Office Note   Date:  01/01/2021   ID:  Emily Villegas, DOB 24-Jul-1956, MRN [id number]  PCP:  Sigmund Hazel, MD  Cardiologist:  Peter Swaziland, MD EP: None  Chief Complaint  Patient presents with  . Coronary Artery Disease      History of Present Illness: Emily Villegas is a 65 y.o. female with PMH of NSTEMI 2/2 ?SCAD, non-obstructive CAD, HTN, and HLD who presents for  follow-up.  She was admitted to [facility] from 04/20/20-04/22/20 after presenting with chest pain and severe HTN.  HsTroponins were elevated (peaked at 4359) and EKG showed non-specific T wave abnormalities. She underwent a cardiac catheterization which showed mild non-obstructive mLAD stenosis and tapering of the distal circumflex suspicious for intramural hematoma likely 2/2 SCAD. Vessels were quite tortuous.  Echo showed EF 60-65%, G1DD, and no significant valvular abnormalities. She was started on aspirin, statin, BBlocker. On amlodipine for HTN.   She had follow up CT head and neck which showed no evidence of fibromuscular dysplasia or aneurysmal disease. It did show indeterminate small vessel infarcts in the right hemisphere white matter (posterior right corona radiata), and in the left cerebellum. She has  seen neurology and MRI showed Scattered periventricular and subcortical chronic small vessel ischemic disease; chronic lacunar infarcts in cerebellum and right corona radiata. No acute findings.   On follow up today she is doing well. Denies any recurrent chest pain. No palpitations. Tolerating medication well. No neurologic issues. Has chronic hip pain. Uses meloxicam intermittently.    Past Medical History:  Diagnosis Date  . Hyperlipidemia   . Hypertension     Past Surgical History:  Procedure Laterality Date  . LEFT HEART CATH AND CORONARY ANGIOGRAPHY N/A 04/21/2020   Procedure: LEFT HEART CATH AND CORONARY ANGIOGRAPHY;  Surgeon: Tonny Bollman, MD;  Location: [facility] INVASIVE CV LAB;   Service: Cardiovascular;  Laterality: N/A;     Current Outpatient Medications  Medication Sig Dispense Refill  . aspirin 81 MG chewable tablet Chew 1 tablet (81 mg total) by mouth daily. 30 tablet 11  . atorvastatin (LIPITOR) 40 MG tablet Take 1 tablet (40 mg total) by mouth daily. 90 tablet 3  . carvedilol (COREG) 12.5 MG tablet Take 1 tablet (12.5 mg total) by mouth 2 (two) times daily with a meal. 60 tablet 11  . meloxicam (MOBIC) 7.5 MG tablet Take 1 tablet (7.5 mg total) by mouth daily. 90 tablet 1  . nitroGLYCERIN (NITROSTAT) 0.4 MG SL tablet Place 1 tablet (0.4 mg total) under the tongue every 5 (five) minutes as needed for chest pain. 25 tablet 1   No current facility-administered medications for this visit.    Allergies:   Patient has no known allergies.    Social History:  The patient  reports that she has never smoked. She has never used smokeless tobacco. She reports current alcohol use of about 7.0 standard drinks of alcohol per week. She reports that she does not use drugs.   Family History:  The patient's family history includes Breast cancer (age of onset: 40) in her niece; Heart attack in her father.    ROS:  Please see the history of present illness.   Otherwise, review of systems are positive for none.   All other systems are reviewed and negative.    PHYSICAL EXAM: VS:  BP 114/72   Pulse 62   Ht 5\' 8"  (1.727 m)   Wt 156 lb 9.6 oz (71 kg)   BMI 23.81 kg/m  ,  BMI Body mass index is 23.81 kg/m. GEN: Well nourished, well developed, in no acute distress HEENT: sclera anicteric Neck: no JVD, carotid bruits, or masses Cardiac: RRR; no murmurs, rubs, or gallops, no edema  Respiratory:  clear to auscultation bilaterally, normal work of breathing GI: soft, nontender, nondistended, + BS MS: no deformity or atrophy Skin: warm and dry, no rash Neuro:  Strength and sensation are intact Psych: euthymic mood, full affect   EKG:  EKG is not ordered today.   Recent  Labs: 04/21/2020: Hemoglobin 12.4; Platelets 241; TSH 2.672 04/22/2020: ALT 16 06/10/2020: BUN 12; Creatinine, Ser 0.83; Potassium 4.3; Sodium 137    Lipid Panel    Component Value Date/Time   CHOL 140 06/10/2020 0822   TRIG 44 06/10/2020 0822   HDL 61 06/10/2020 0822   CHOLHDL 2.3 06/10/2020 0822   CHOLHDL 2.7 04/21/2020 0259   VLDL 9 04/21/2020 0259   LDLCALC 69 06/10/2020 0822      Wt Readings from Last 3 Encounters:  01/01/21 156 lb 9.6 oz (71 kg)  12/31/20 159 lb (72.1 kg)  10/08/20 159 lb (72.1 kg)      Other studies Reviewed: Additional studies/ records that were reviewed today include:   LEFT HEART CATH AND CORONARY ANGIOGRAPHY 04/21/20  Conclusion  1. Mild nonobstructive mid-LAD stenosis 2. Tapering of the distal circumflex suspicious for intramural hematoma 3. Widely patent dominant RCA 4. Mild hypokinesis of the distal inferior wall/inferoapex with preserved overall LVEF of 55-65%  Recommend: medical therapy   Diagnostic Dominance: Right     Echo 04/21/20 1. Left ventricular ejection fraction, by estimation, is 60 to 65%. The  left ventricle has normal function. The left ventricle has no regional  wall motion abnormalities. Left ventricular diastolic parameters are  consistent with Grade I diastolic  dysfunction (impaired relaxation).  2. Right ventricular systolic function is normal. The right ventricular  size is normal. There is normal pulmonary artery systolic pressure. The  estimated right ventricular systolic pressure is 21.5 mmHg.  3. The mitral valve is normal in structure. No evidence of mitral valve  regurgitation. No evidence of mitral stenosis.  4. The aortic valve is tricuspid. Aortic valve regurgitation is not  visualized. Mild aortic valve sclerosis is present, with no evidence of  aortic valve stenosis.  5. The inferior vena cava is normal in size with greater than 50%  respiratory variability, suggesting right atrial pressure  of 3 mmHg.   CT ANGIOGRAPHY HEAD AND NECK  TECHNIQUE: Multidetector CT imaging of the head and neck was performed using the standard protocol during bolus administration of intravenous contrast. Multiplanar CT image reconstructions and MIPs were obtained to evaluate the vascular anatomy. Carotid stenosis measurements (when applicable) are obtained utilizing NASCET criteria, using the distal internal carotid diameter as the denominator.  CONTRAST:  61mL ISOVUE-370 IOPAMIDOL (ISOVUE-370) INJECTION 76%  COMPARISON:  None.  FINDINGS: CT HEAD  Brain: Cerebral volume is within normal limits for age. No midline shift, ventriculomegaly, mass effect, evidence of mass lesion, intracranial hemorrhage or evidence of cortically based acute infarction.  Focal hypodensity in the posterior right corona radiata (series 3, image 20 and series 8, image 14) is age indeterminate but has a more chronic appearance.  There is also evidence of a small chronic infarct in the left cerebellum on series 3, image 9.  Elsewhere gray-white matter differentiation is within normal limits. No cortical encephalomalacia identified.  Calvarium and skull base: Negative.  Paranasal sinuses: Clear.  Orbits: Orbit and scalp  soft tissues are within normal limits.  CTA NECK  Skeleton: No acute osseous abnormality identified. Mild for age cervical spine degeneration. Exaggerated upper thoracic kyphosis and mild levoconvex scoliosis.  Upper chest: Negative.  Other neck: Tiny hypodensities in the thyroid do not meet size criteria for follow-up (ref: J Am Coll Radiol. 2015 Feb;12(2): 143-50).The glottis is closed. Neck soft tissue contours are within normal limits. No lymphadenopathy.  Aortic arch: 3 vessel arch configuration.  No arch atherosclerosis.  Right carotid system: Negative.  Left carotid system: Negative; anatomic variation in the form of a arterial branch arising from the  medial bifurcation with a small associated infundibulum (series 11, image 100). This is probably the ascending pharyngeal artery on that side.  Vertebral arteries: Negative. Fairly codominant vertebral arteries. There is some right V1 segment paravertebral venous contrast contamination.  CTA HEAD  Posterior circulation: Fairly codominant distal vertebral arteries are normal to the vertebrobasilar junction. Normal PICA origins. No stenosis. Patent vertebrobasilar junction and basilar artery without stenosis. Normal SCA and right PCA origins. Fetal type left PCA origin. Both posterior communicating arteries are present. Bilateral PCA branches are within normal limits.  Anterior circulation: Both ICA siphons are patent. Trace if any siphon plaque. No ICA siphon stenosis. Normal ophthalmic and posterior communicating artery origins. Patent carotid termini. Normal MCA and ACA origins. Dominant right ACA A1 segment. Diminutive anterior communicating artery. The right A2 and distal branches are also dominant. Left MCA M1 segment bifurcates early without stenosis. Left MCA branches are within normal limits. Right MCA M1 segment and trifurcation are patent without stenosis. Right MCA branches are within normal limits.  Venous sinuses: Patent. Dominant right transverse and sigmoid sinuses. Normal arachnoid granulation at the junction of the left transverse and sigmoid.  Anatomic variants: Small innominate arterial branch arising from the left carotid bifurcation with an infundibulum. Fetal type left PCA origin. Dominant right ACA. Dominant right transverse and sigmoid venous sinuses.  Review of the MIP images confirms the above findings  IMPRESSION: 1. No atherosclerosis or stenosis on CTA head and neck.  2. However, there are age indeterminate small vessel infarcts in the right hemisphere white matter (posterior right corona radiata), and in the left cerebellum. The former  could be the symptomatic lesion with respect to left side paresthesia. No intracranial hemorrhage or mass effect.   Electronically Signed   By: Odessa Fleming M.D.   On: 06/29/2020 13:00    ASSESSMENT AND PLAN:  1.  NSTEMI in May 2021  Not definitely SCAD but no other clear etiology: no recurrent chest pain.  - Continue aspirin and statin - Continue carvedilol - follow up in one year  2. HTN: BP is well controlled - Continue carvedilol   3. HLD: LDL 106>>69. on atorvastatin     Current medicines are reviewed at length with the patient today.  The patient does not have concerns regarding medicines.  The following changes have been made:  As above  Labs/ tests ordered today include:   No orders of the defined types were placed in this encounter.    Disposition:   FU with Dr. Swaziland in one year  Signed, Peter Swaziland, MD  01/01/2021 1:28 PM

## 2020-12-31 ENCOUNTER — Encounter: Payer: Self-pay | Admitting: Family Medicine

## 2020-12-31 ENCOUNTER — Other Ambulatory Visit: Payer: Self-pay

## 2020-12-31 ENCOUNTER — Ambulatory Visit (INDEPENDENT_AMBULATORY_CARE_PROVIDER_SITE_OTHER): Payer: Medicare Other | Admitting: Family Medicine

## 2020-12-31 DIAGNOSIS — M25552 Pain in left hip: Secondary | ICD-10-CM | POA: Diagnosis not present

## 2020-12-31 NOTE — Assessment & Plan Note (Signed)
Patient has known arthritic changes.  Only moderate but secondary to the osteoarthritic changes that I am concerned that patient may need the possibility of a replacement sooner than later.  We discussed other things such as that physical therapy which did not help patient.  We also discussed the potential of injections but I think it would be short-lived.  Patient at this point would like to continue with conservative therapy and will just monitor her working out.  Is doing the meloxicam in 3 to 5-day burst and will discuss with cardiologist this week if this is fine for her to continue to do.  Patient does have the gabapentin for nighttime relief if necessary.  Follow-up with me again more on an as-needed basis.  Total time reviewing patient's chart and talking to patient 32 minutes

## 2020-12-31 NOTE — Progress Notes (Signed)
[name] [address] [place] [address] Phone: [phone] Subjective:   Emily Villegas, am serving as a scribe for Dr. Antoine Primas. This visit occurred during the SARS-CoV-2 public health emergency.  Safety protocols were in place, including screening questions prior to the visit, additional usage of staff PPE, and extensive cleaning of exam room while observing appropriate contact time as indicated for disinfecting solutions.   I'm seeing this patient by the request  of:  Sigmund Hazel, MD  CC: Left hip pain follow-up  ZYS:[id number]   10/08/2020 Patient does have more of the left hip pain with underlying arthritic changes.  Patient bone does have more of a osteophyte that is likely contributing.  I do believe that physical therapy could be beneficial and help slow down progression and help with the pain control.  Patient is responding well to the meloxicam will send message to cardiology to make sure that they are fine with this being longstanding with patient's recent cardiac history.  We will be doing it in 5-day burst when patient is having pain.  Patient is able to do daily activities without any significant discomfort at this time and will follow up with me again in 2 to 3 months  Update 12/31/2020 Emily Villegas is a 65 y.o. female coming in with complaint of left hip pain. Patient states that working out seems to be increasing her hip pain. Pain over hip flexor but pain moves around. Patient had pain with physical therapy. Patient states that on days she works out or walks long amount of time she starts having increasing discomfort in the groin area.  Patient does have x-rays showing moderate arthritic changes but does have multiple bone spurs that does cause impingement of the hip.      Past Medical History:  Diagnosis Date  . Hyperlipidemia   . Hypertension    Past Surgical History:  Procedure Laterality Date  . LEFT HEART CATH  AND CORONARY ANGIOGRAPHY N/A 04/21/2020   Procedure: LEFT HEART CATH AND CORONARY ANGIOGRAPHY;  Surgeon: Tonny Bollman, MD;  Location: [facility] INVASIVE CV LAB;  Service: Cardiovascular;  Laterality: N/A;   Social History   Socioeconomic History  . Marital status: Married    Spouse name: Casimiro Needle  . Number of children: Not on file  . Years of education: Not on file  . Highest education level: Master's degree (e.g., MA, MS, MEng, MEd, MSW, MBA)  Occupational History  . Not on file  Tobacco Use  . Smoking status: Never Smoker  . Smokeless tobacco: Never Used  Substance and Sexual Activity  . Alcohol use: Yes    Alcohol/week: 7.0 standard drinks    Types: 7 Glasses of wine per week    Comment: 1-2 drinks most days of the week  . Drug use: No  . Sexual activity: Not on file  Other Topics Concern  . Not on file  Social History Narrative   Lives with husband   Caffeine- coffee 2 c daily   Social Determinants of Health   Financial Resource Strain: Not on file  Food Insecurity: Not on file  Transportation Needs: Not on file  Physical Activity: Not on file  Stress: Not on file  Social Connections: Not on file   No Known Allergies Family History  Problem Relation Age of Onset  . Heart attack Father        late 35s  . Breast cancer Niece 18     Current Outpatient Medications (  Cardiovascular):  .  atorvastatin (LIPITOR) 40 MG tablet, Take 1 tablet (40 mg total) by mouth daily. .  carvedilol (COREG) 12.5 MG tablet, Take 1 tablet (12.5 mg total) by mouth 2 (two) times daily with a meal. .  nitroGLYCERIN (NITROSTAT) 0.4 MG SL tablet, Place 1 tablet (0.4 mg total) under the tongue every 5 (five) minutes as needed for chest pain.   Current Outpatient Medications (Analgesics):  .  aspirin 81 MG chewable tablet, Chew 1 tablet (81 mg total) by mouth daily. .  meloxicam (MOBIC) 7.5 MG tablet, Take 1 tablet (7.5 mg total) by mouth daily.   Current Outpatient Medications (Other):  .   gabapentin (NEURONTIN) 100 MG capsule, Take 2 capsules (200 mg total) by mouth at bedtime.   Reviewed prior external information including notes and imaging from  primary care provider As well as notes that were available from care everywhere and other healthcare systems.  Past medical history, social, surgical and family history all reviewed in electronic medical record radicular pain.  Pertanent information unless stated regarding to the chief complaint.   Review of Systems:  No headache, visual changes, nausea, vomiting, diarrhea, constipation, dizziness, abdominal pain, skin rash, fevers, chills, night sweats, weight loss, swollen lymph nodes, body aches, joint swelling, chest pain, shortness of breath, mood changes. POSITIVE muscle aches  Objective  Blood pressure 122/88, pulse (!) 57, height 5\' 8"  (1.727 m), weight 159 lb (72.1 kg), SpO2 99 %.   General: No apparent distress alert and oriented x3 mood and affect normal, dressed appropriately.  HEENT: Pupils equal, extraocular movements intact  Respiratory: Patient's speak in full sentences and does not appear short of breath  Cardiovascular: No lower extremity edema, non tender, no erythema  Gait normal with good balance and coordination.  MSK: Left hip does have decrease in right internal/external range of motion compared to the contralateral side.  Patient does have good    Impression and Recommendations:     The above documentation has been reviewed and is accurate and complete , DO

## 2020-12-31 NOTE — Patient Instructions (Signed)
Discussed options at this point keep trucking along Ask cardiologist about meloxicam but likely can do in 3-5 day bursts as needed Try to stay active but listen to your body At any point can do referral to ortho See me when you need me

## 2021-01-01 ENCOUNTER — Other Ambulatory Visit: Payer: Self-pay

## 2021-01-01 ENCOUNTER — Ambulatory Visit (INDEPENDENT_AMBULATORY_CARE_PROVIDER_SITE_OTHER): Payer: Medicare Other | Admitting: Cardiology

## 2021-01-01 ENCOUNTER — Encounter: Payer: Self-pay | Admitting: Cardiology

## 2021-01-01 VITALS — BP 114/72 | HR 62 | Ht 68.0 in | Wt 156.6 lb

## 2021-01-01 DIAGNOSIS — E785 Hyperlipidemia, unspecified: Secondary | ICD-10-CM | POA: Diagnosis not present

## 2021-01-01 DIAGNOSIS — I10 Essential (primary) hypertension: Secondary | ICD-10-CM

## 2021-01-01 DIAGNOSIS — I2510 Atherosclerotic heart disease of native coronary artery without angina pectoris: Secondary | ICD-10-CM | POA: Diagnosis not present

## 2021-03-20 ENCOUNTER — Other Ambulatory Visit: Payer: Self-pay | Admitting: Cardiology

## 2021-05-14 IMAGING — CR DG CHEST 2V
2 series · 2 of 2 positions shown · non-contrast
Comparison: May 16, 2019

CLINICAL DATA: Sternal chest pressure.

EXAM:
CHEST - 2 VIEW

[w chest pa]
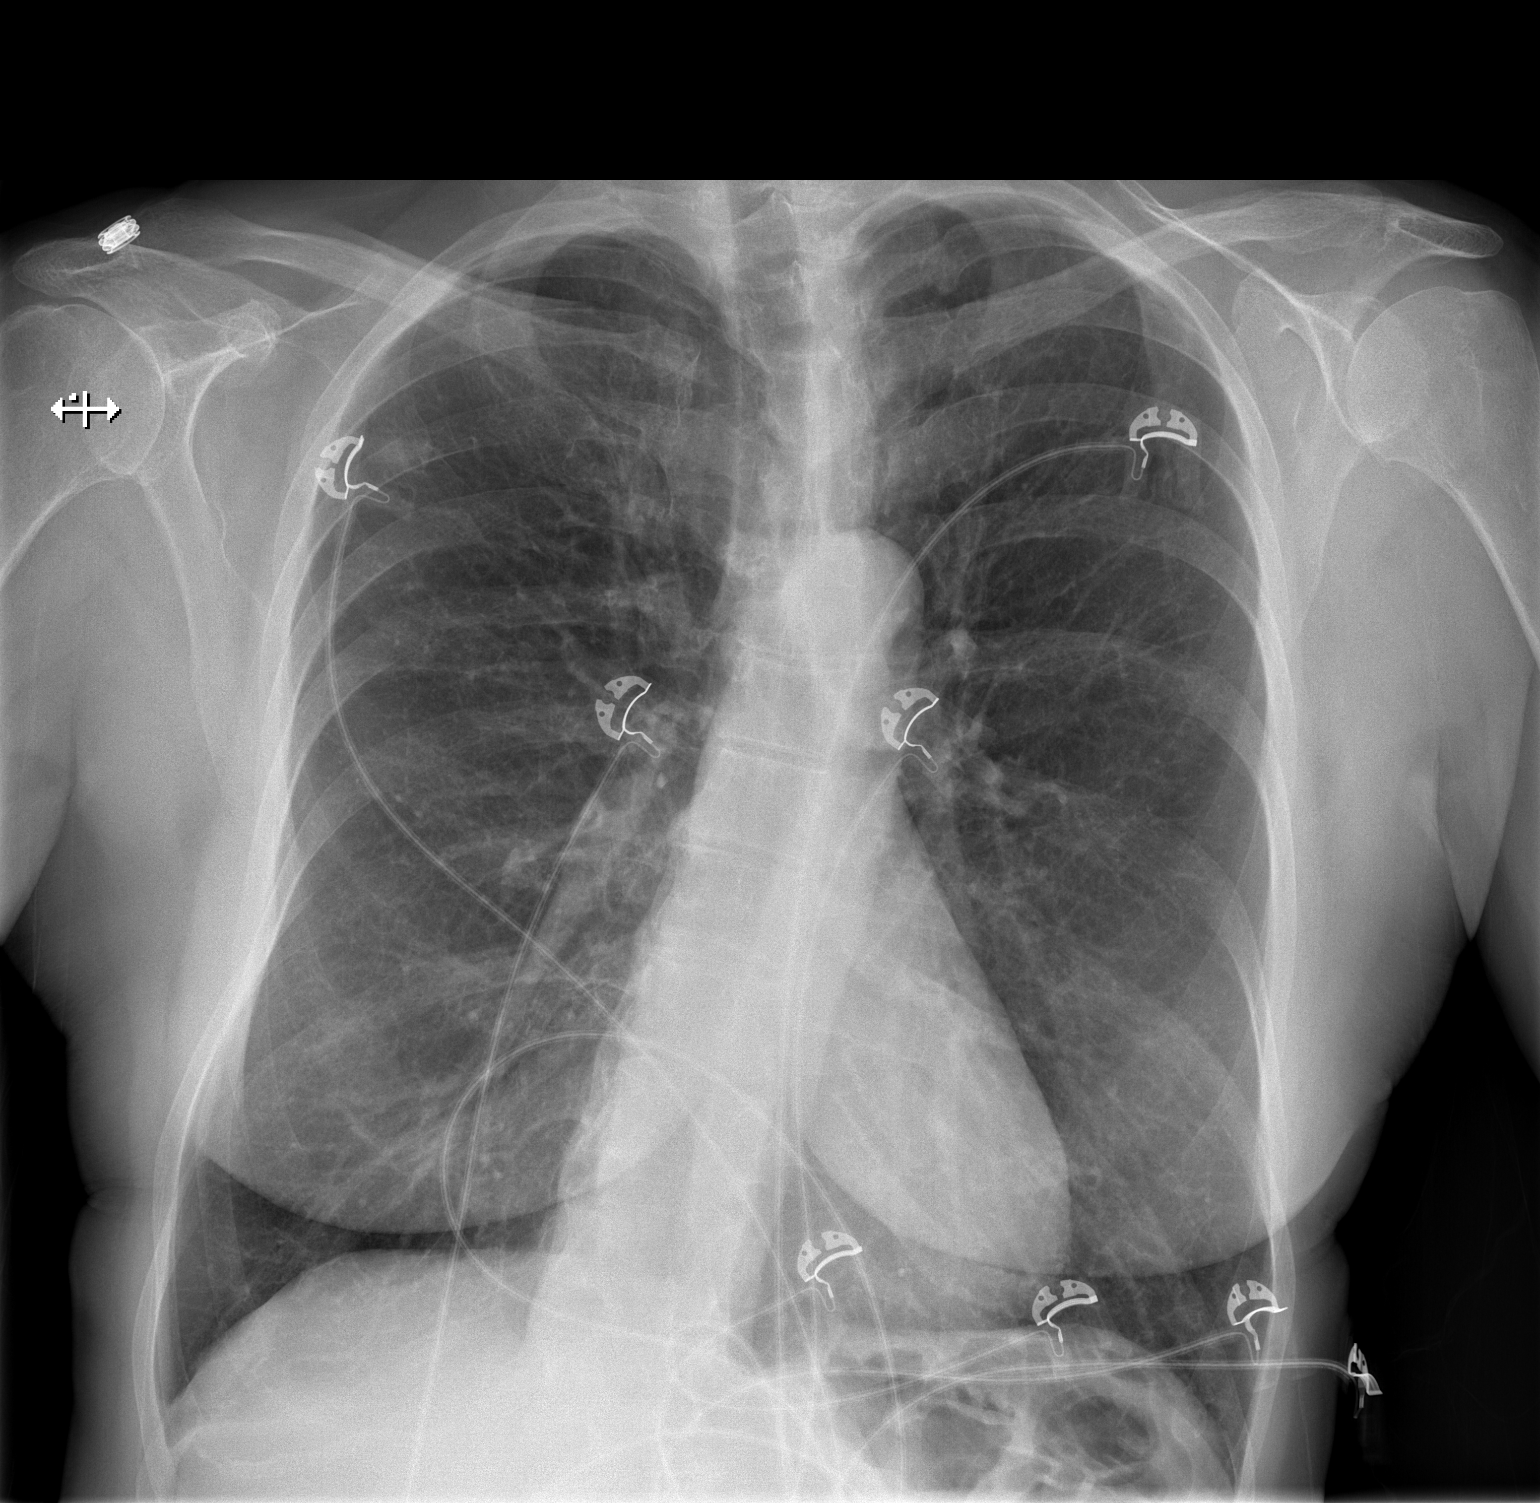

[w chest lat]
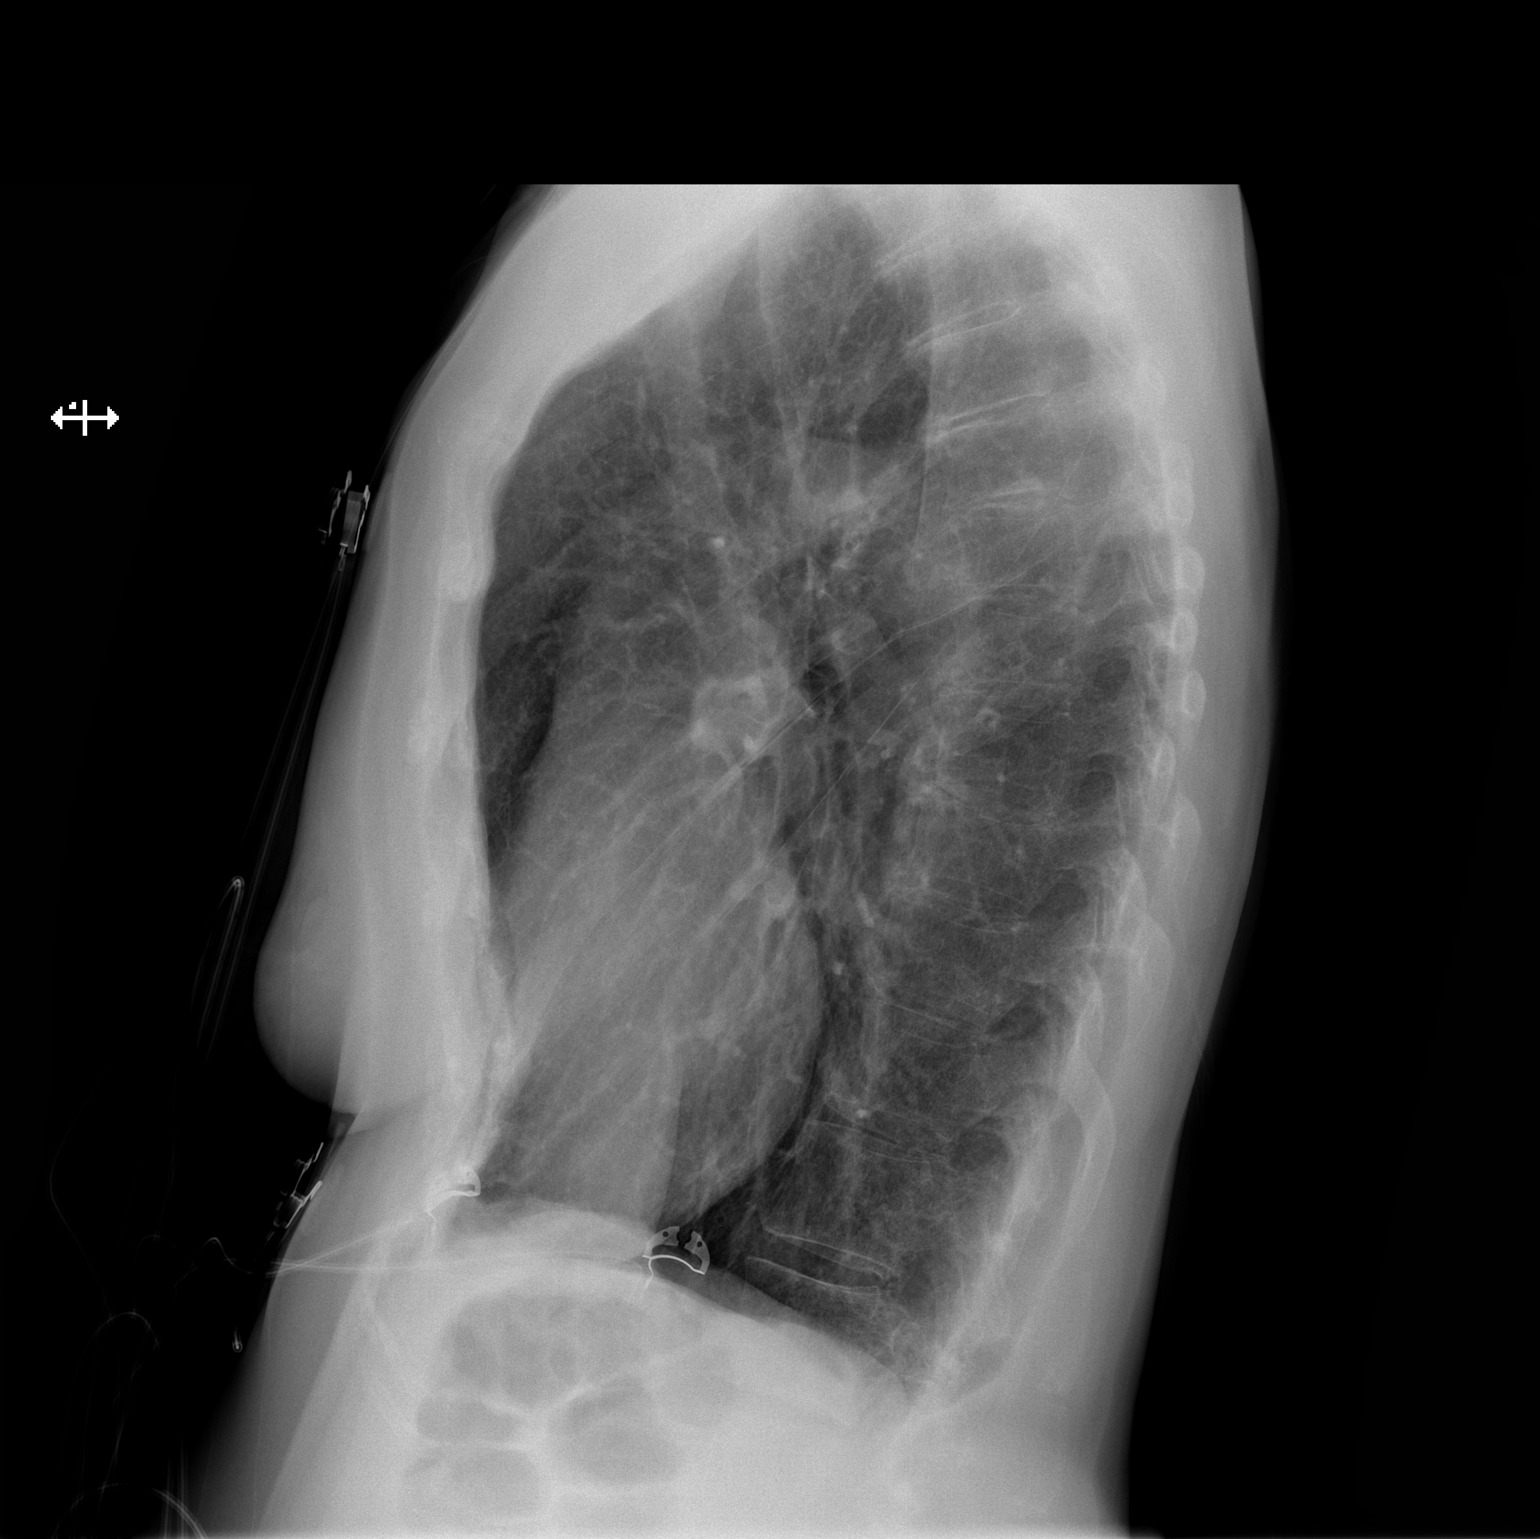

[2 of 2 positions shown; findings below may reference images not displayed]

FINDINGS: The lungs are hyperinflated. There is no evidence of acute
infiltrate, pleural effusion or pneumothorax. The heart size and
mediastinal contours are within normal limits. There is stable mild
to moderate severity scoliosis of the thoracolumbar spine.
IMPRESSION: No active cardiopulmonary disease.

## 2021-07-26 ENCOUNTER — Other Ambulatory Visit: Payer: Self-pay | Admitting: Cardiology

## 2021-08-20 ENCOUNTER — Other Ambulatory Visit: Payer: Self-pay | Admitting: Family Medicine

## 2021-08-20 DIAGNOSIS — Z1231 Encounter for screening mammogram for malignant neoplasm of breast: Secondary | ICD-10-CM

## 2021-09-10 ENCOUNTER — Ambulatory Visit: Payer: Medicare Other

## 2021-09-24 ENCOUNTER — Inpatient Hospital Stay: Admission: RE | Admit: 2021-09-24 | Payer: Medicare Other | Source: Ambulatory Visit

## 2021-09-26 IMAGING — DX DG LUMBAR SPINE 2-3V
3 series · 3 of 3 positions shown · non-contrast
Comparison: None.

CLINICAL DATA: Low back pain.

EXAM:
LUMBAR SPINE - 2-3 VIEW

[l-spine ap]
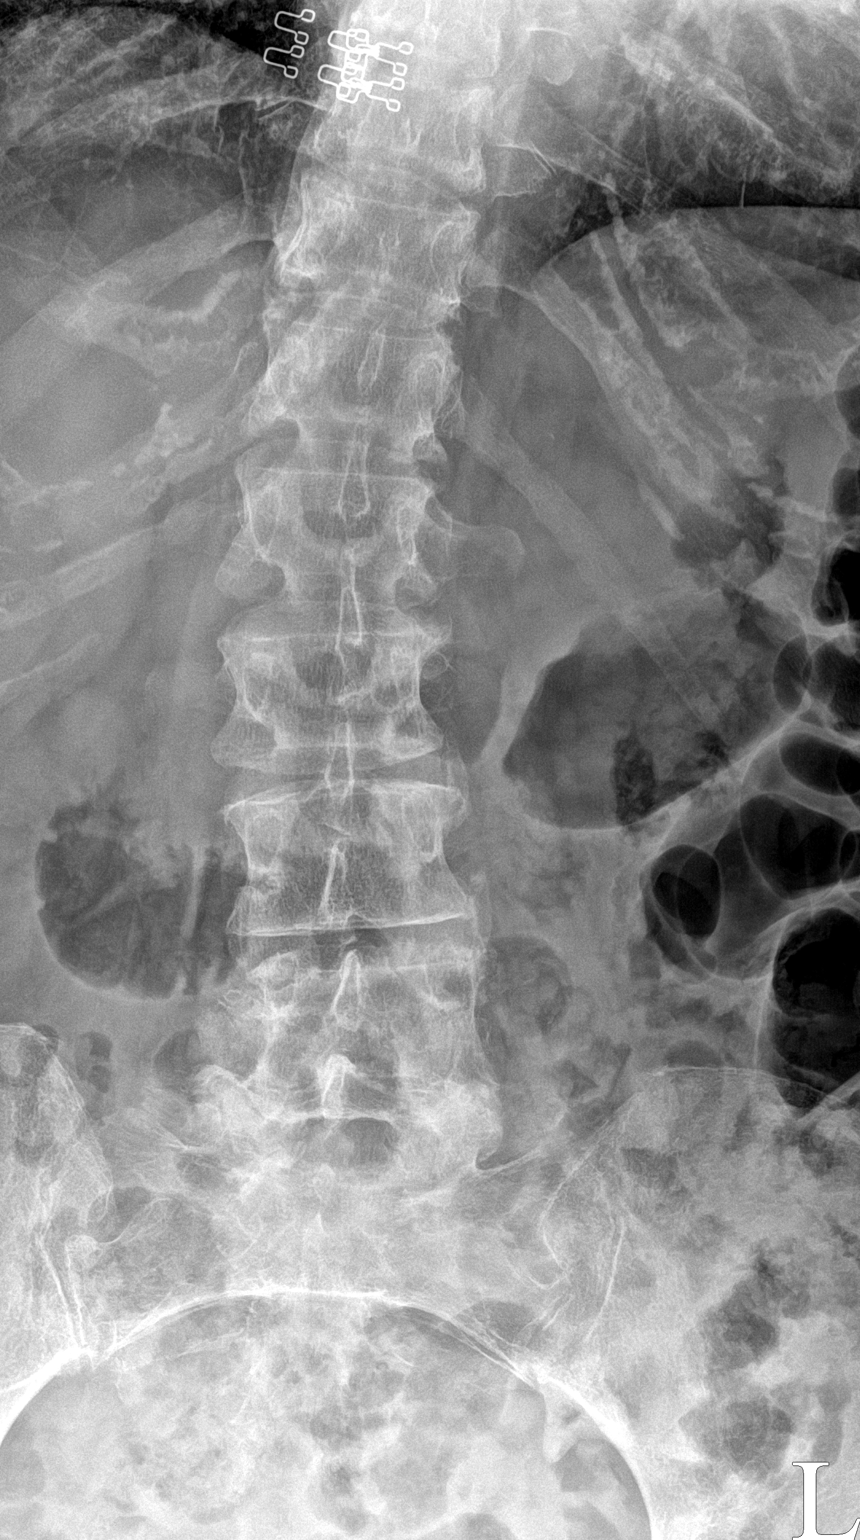

[l-spine lateral (1 of 2)]
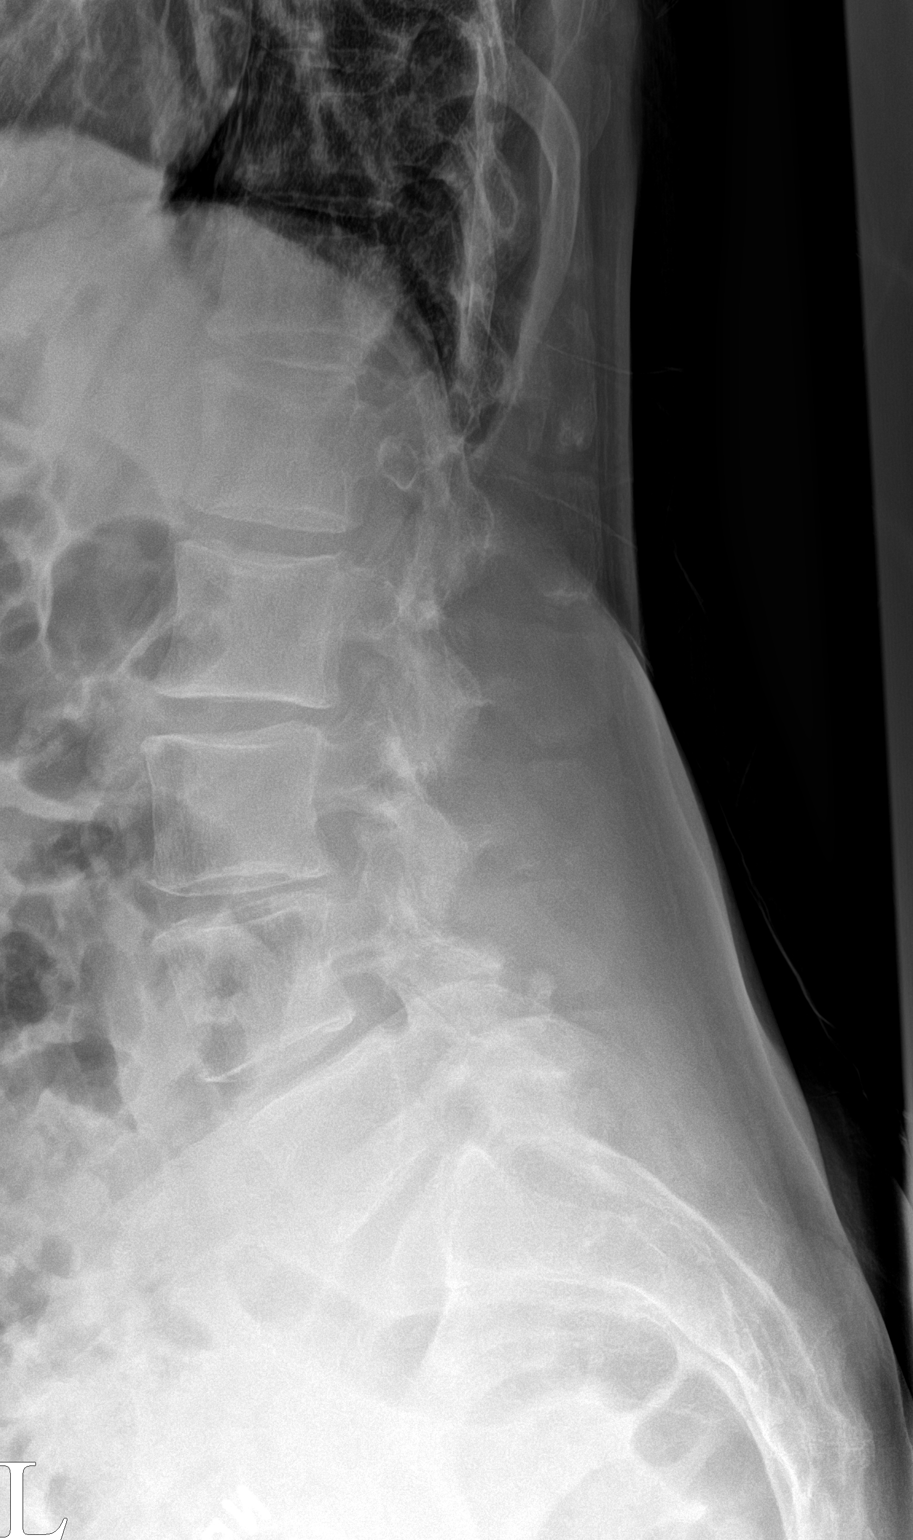

[l-spine lateral (2 of 2)]
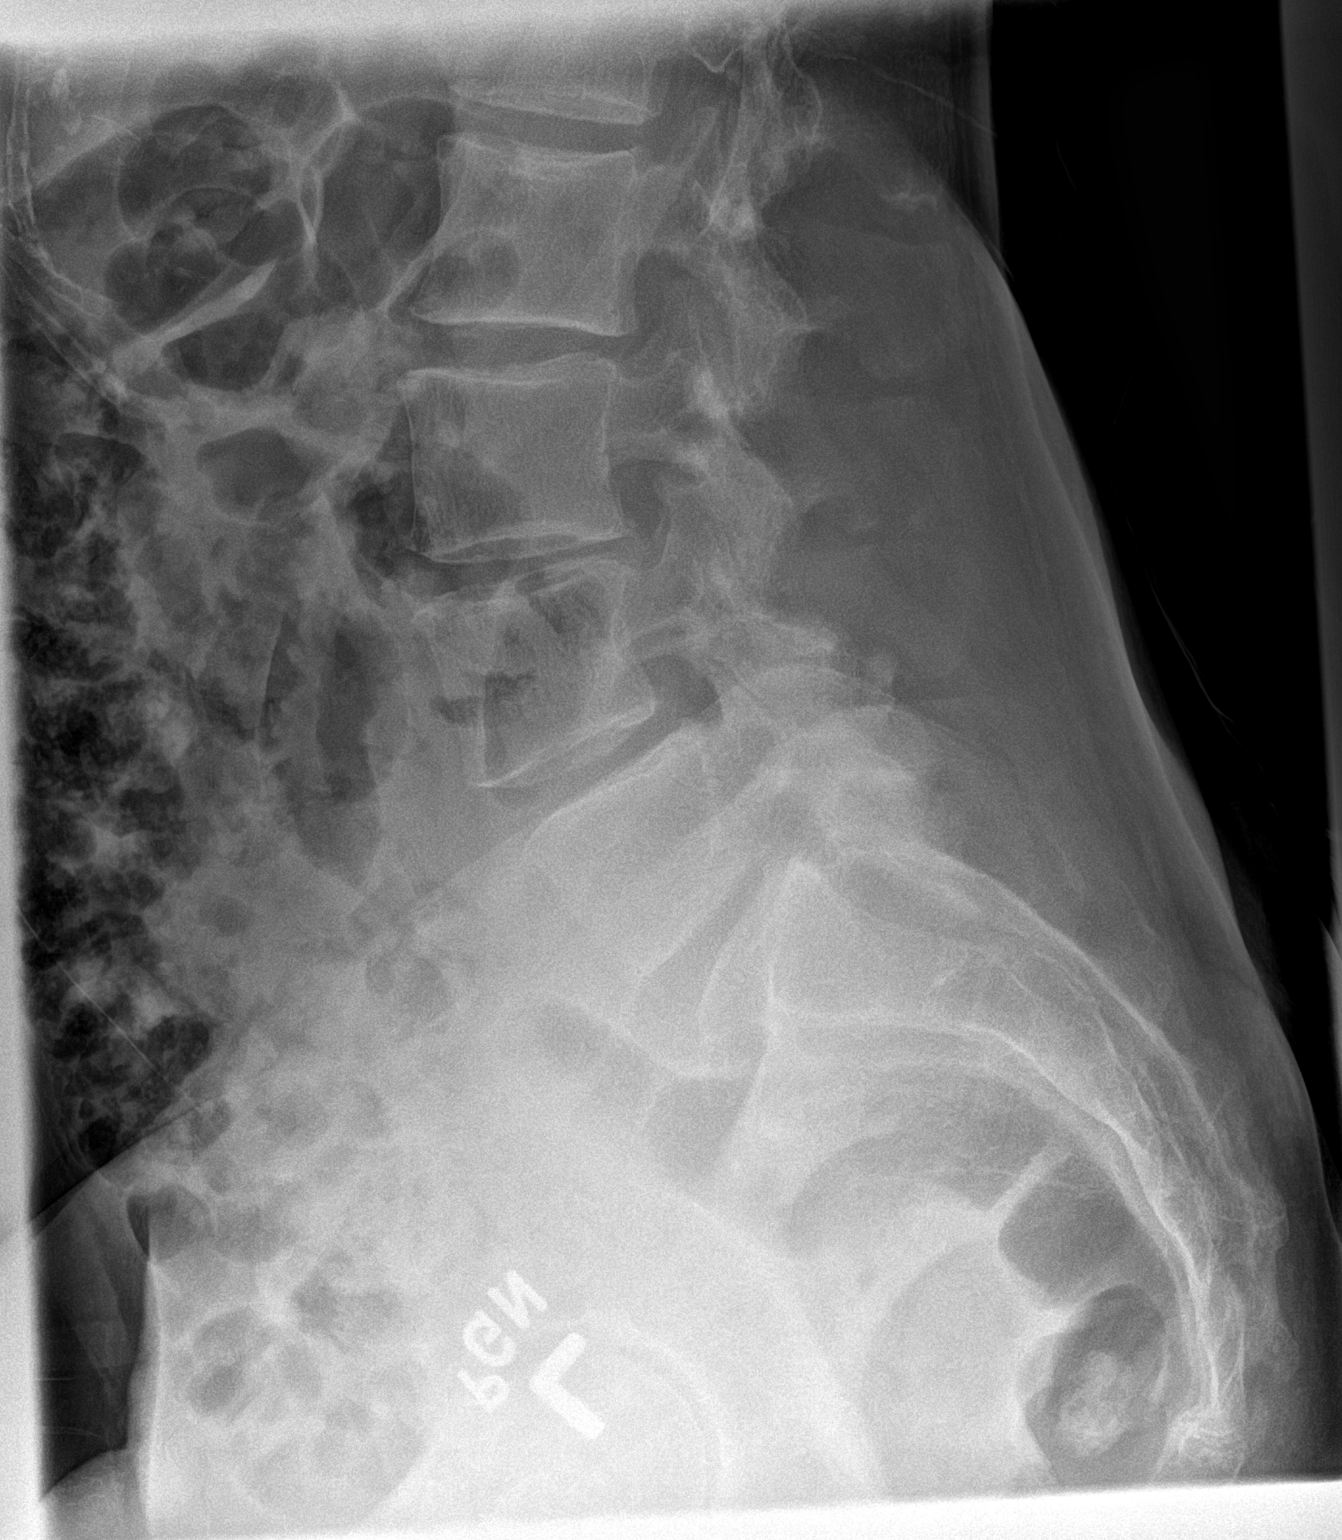

[3 of 3 positions shown; findings below may reference images not displayed]

FINDINGS: Minimal anterolisthesis of L4 on L5. Otherwise alignment intact.
Vertebral body heights are maintained. Multilevel mild to moderate
intervertebral disc space loss most pronounced at L3-4 and L5-S1.
Lower lumbar facet arthropathy. SI joints are open and symmetric.
Nonobstructive bowel gas pattern.
IMPRESSION: 1. No acute osseous abnormality of the lumbar spine.
2. Multilevel mild to moderate degenerative disc disease and facet
arthropathy.

## 2021-10-04 ENCOUNTER — Other Ambulatory Visit: Payer: Self-pay

## 2021-10-04 ENCOUNTER — Ambulatory Visit
Admission: RE | Admit: 2021-10-04 | Discharge: 2021-10-04 | Disposition: A | Discharge status: Home / Self Care | Payer: Medicare Other | Source: Ambulatory Visit | Attending: Family Medicine | Admitting: Family Medicine

## 2021-10-04 DIAGNOSIS — Z1231 Encounter for screening mammogram for malignant neoplasm of breast: Secondary | ICD-10-CM

## 2022-01-16 NOTE — Progress Notes (Signed)
Cardiology Office Note   Date:  01/21/2022   ID:  Emily Villegas, DOB [birth year], MRN [id number]  PCP:  Sigmund Hazel, MD  Cardiologist:  Lukisha Procida [place], MD EP: None  Chief Complaint  Patient presents with   Coronary Artery Disease      History of Present Illness: Emily Villegas is a 66 y.o. female with PMH of NSTEMI 2/2 ?SCAD, non-obstructive CAD, HTN, and HLD who presents for  follow-up.  She was admitted to [facility] from 04/20/20-04/22/20 after presenting with chest pain and severe HTN.  HsTroponins were elevated (peaked at 4359) and EKG showed non-specific T wave abnormalities. She underwent a cardiac catheterization which showed mild non-obstructive mLAD stenosis and tapering of the distal circumflex suspicious for intramural hematoma likely 2/2 SCAD. Vessels were quite tortuous.  Echo showed EF 60-65%, G1DD, and no significant valvular abnormalities. She was started on aspirin, statin, BBlocker. On amlodipine for HTN.   She had follow up CT head and neck which showed no evidence of fibromuscular dysplasia or aneurysmal disease. It did show indeterminate small vessel infarcts in the right hemisphere white matter (posterior right corona radiata), and in the left cerebellum. She has  seen neurology and MRI showed Scattered periventricular and subcortical chronic small vessel ischemic disease; chronic lacunar infarcts in cerebellum and right corona radiata. No acute findings.   On follow up today she is doing well. Denies any recurrent chest pain. No palpitations. Tolerating medication well.  Has chronic hip pain. Uses meloxicam intermittently.    Past Medical History:  Diagnosis Date   Hyperlipidemia    Hypertension     Past Surgical History:  Procedure Laterality Date   LEFT HEART CATH AND CORONARY ANGIOGRAPHY N/A 04/21/2020   Procedure: LEFT HEART CATH AND CORONARY ANGIOGRAPHY;  Surgeon: Tonny Bollman, MD;  Location: [facility] INVASIVE CV LAB;  Service: Cardiovascular;   Laterality: N/A;     Current Outpatient Medications  Medication Sig Dispense Refill   atorvastatin (LIPITOR) 40 MG tablet TAKE 1 TABLET BY MOUTH EVERY DAY 90 tablet 1   carvedilol (COREG) 12.5 MG tablet TAKE 1 TABLET BY MOUTH TWICE A DAY WITH MEAL 180 tablet 3   meloxicam (MOBIC) 7.5 MG tablet Take 1 tablet (7.5 mg total) by mouth daily. 90 tablet 1   nitroGLYCERIN (NITROSTAT) 0.4 MG SL tablet Place 1 tablet (0.4 mg total) under the tongue every 5 (five) minutes as needed for chest pain. 25 tablet 1   No current facility-administered medications for this visit.    Allergies:   Patient has no known allergies.    Social History:  The patient  reports that she has never smoked. She has never used smokeless tobacco. She reports current alcohol use of about 7.0 standard drinks per week. She reports that she does not use drugs.   Family History:  The patient's family history includes Breast cancer (age of onset: 61) in her niece; Heart attack in her father.    ROS:  Please see the history of present illness.   Otherwise, review of systems are positive for none.   All other systems are reviewed and negative.    PHYSICAL EXAM: VS:  BP (!) 144/87    Pulse 61    Ht 5\' 7"  (1.702 m)    Wt 153 lb 9.6 oz (69.7 kg)    SpO2 100%    BMI 24.06 kg/m  , BMI Body mass index is 24.06 kg/m. GEN: Well nourished, well developed, in no acute distress HEENT: sclera anicteric  Neck: no JVD, carotid bruits, or masses Cardiac: RRR; no murmurs, rubs, or gallops, no edema  Respiratory:  clear to auscultation bilaterally, normal work of breathing GI: soft, nontender, nondistended, + BS MS: no deformity or atrophy Skin: warm and dry, no rash Neuro:  Strength and sensation are intact Psych: euthymic mood, full affect   EKG:  EKG is ordered today. NSR rate 61. Normal. I have personally reviewed and interpreted this study.    Recent Labs: No results found for requested labs within last 8760 hours.     Lipid Panel    Component Value Date/Time   CHOL 140 06/10/2020 0822   TRIG 44 06/10/2020 0822   HDL 61 06/10/2020 0822   CHOLHDL 2.3 06/10/2020 0822   CHOLHDL 2.7 04/21/2020 0259   VLDL 9 04/21/2020 0259   LDLCALC 69 06/10/2020 0822   Dated 02/05/21: cholesterol 153, triglycerides 72, HDL 50, LDL 88. CBC and CMET normal  Wt Readings from Last 3 Encounters:  01/21/22 153 lb 9.6 oz (69.7 kg)  01/01/21 156 lb 9.6 oz (71 kg)  12/31/20 159 lb (72.1 kg)      Other studies Reviewed: Additional studies/ records that were reviewed today include:   LEFT HEART CATH AND CORONARY ANGIOGRAPHY 04/21/20  Conclusion   1. Mild nonobstructive mid-LAD stenosis 2. Tapering of the distal circumflex suspicious for intramural hematoma 3. Widely patent dominant RCA 4. Mild hypokinesis of the distal inferior wall/inferoapex with preserved overall LVEF of 55-65%   Recommend: medical therapy    Diagnostic Dominance: Right       Echo 04/21/20  1. Left ventricular ejection fraction, by estimation, is 60 to 65%. The  left ventricle has normal function. The left ventricle has no regional  wall motion abnormalities. Left ventricular diastolic parameters are  consistent with Grade I diastolic  dysfunction (impaired relaxation).   2. Right ventricular systolic function is normal. The right ventricular  size is normal. There is normal pulmonary artery systolic pressure. The  estimated right ventricular systolic pressure is 21.5 mmHg.   3. The mitral valve is normal in structure. No evidence of mitral valve  regurgitation. No evidence of mitral stenosis.   4. The aortic valve is tricuspid. Aortic valve regurgitation is not  visualized. Mild aortic valve sclerosis is present, with no evidence of  aortic valve stenosis.   5. The inferior vena cava is normal in size with greater than 50%  respiratory variability, suggesting right atrial pressure of 3 mmHg.   CT ANGIOGRAPHY HEAD AND NECK    TECHNIQUE: Multidetector CT imaging of the head and neck was performed using the standard protocol during bolus administration of intravenous contrast. Multiplanar CT image reconstructions and MIPs were obtained to evaluate the vascular anatomy. Carotid stenosis measurements (when applicable) are obtained utilizing NASCET criteria, using the distal internal carotid diameter as the denominator.   CONTRAST:  36mL ISOVUE-370 IOPAMIDOL (ISOVUE-370) INJECTION 76%   COMPARISON:  None.   FINDINGS: CT HEAD   Brain: Cerebral volume is within normal limits for age. No midline shift, ventriculomegaly, mass effect, evidence of mass lesion, intracranial hemorrhage or evidence of cortically based acute infarction.   Focal hypodensity in the posterior right corona radiata (series 3, image 20 and series 8, image 31) is age indeterminate but has a more chronic appearance.   There is also evidence of a small chronic infarct in the left cerebellum on series 3, image 9.   Elsewhere gray-white matter differentiation is within normal limits. No cortical encephalomalacia identified.  Calvarium and skull base: Negative.   Paranasal sinuses: Clear.   Orbits: Orbit and scalp soft tissues are within normal limits.   CTA NECK   Skeleton: No acute osseous abnormality identified. Mild for age cervical spine degeneration. Exaggerated upper thoracic kyphosis and mild levoconvex scoliosis.   Upper chest: Negative.   Other neck: Tiny hypodensities in the thyroid do not meet size criteria for follow-up (ref: J Am Coll Radiol. 2015 Feb;12(2): 143-50).The glottis is closed. Neck soft tissue contours are within normal limits. No lymphadenopathy.   Aortic arch: 3 vessel arch configuration.  No arch atherosclerosis.   Right carotid system: Negative.   Left carotid system: Negative; anatomic variation in the form of a arterial branch arising from the medial bifurcation with a small associated  infundibulum (series 11, image 100). This is probably the ascending pharyngeal artery on that side.   Vertebral arteries: Negative. Fairly codominant vertebral arteries. There is some right V1 segment paravertebral venous contrast contamination.   CTA HEAD   Posterior circulation: Fairly codominant distal vertebral arteries are normal to the vertebrobasilar junction. Normal PICA origins. No stenosis. Patent vertebrobasilar junction and basilar artery without stenosis. Normal SCA and right PCA origins. Fetal type left PCA origin. Both posterior communicating arteries are present. Bilateral PCA branches are within normal limits.   Anterior circulation: Both ICA siphons are patent. Trace if any siphon plaque. No ICA siphon stenosis. Normal ophthalmic and posterior communicating artery origins. Patent carotid termini. Normal MCA and ACA origins. Dominant right ACA A1 segment. Diminutive anterior communicating artery. The right A2 and distal branches are also dominant. Left MCA M1 segment bifurcates early without stenosis. Left MCA branches are within normal limits. Right MCA M1 segment and trifurcation are patent without stenosis. Right MCA branches are within normal limits.   Venous sinuses: Patent. Dominant right transverse and sigmoid sinuses. Normal arachnoid granulation at the junction of the left transverse and sigmoid.   Anatomic variants: Small innominate arterial branch arising from the left carotid bifurcation with an infundibulum. Fetal type left PCA origin. Dominant right ACA. Dominant right transverse and sigmoid venous sinuses.   Review of the MIP images confirms the above findings   IMPRESSION: 1. No atherosclerosis or stenosis on CTA head and neck.   2. However, there are age indeterminate small vessel infarcts in the right hemisphere white matter (posterior right corona radiata), and in the left cerebellum. The former could be the symptomatic lesion with  respect to left side paresthesia. No intracranial hemorrhage or mass effect.     Electronically Signed   By: Odessa Fleming M.D.   On: 06/29/2020 13:00     ASSESSMENT AND PLAN:  1.  NSTEMI in May 2021  Likely SCAD of distal LCx - no recurrent chest pain.  - Continue statin and Coreg - follow up in one year  2. HTN: BP is well controlled - Continue carvedilol   3. HLD: LDL 106>>69. on atorvastatin. Labs followed by PCP    Current medicines are reviewed at length with the patient today.  The patient does not have concerns regarding medicines.  The following changes have been made:  As above  Labs/ tests ordered today include:   No orders of the defined types were placed in this encounter.    Disposition:   FU with Dr. [place] in one year  Signed, Aleta Manternach [place], MD  01/21/2022 10:15 AM

## 2022-01-21 ENCOUNTER — Ambulatory Visit (INDEPENDENT_AMBULATORY_CARE_PROVIDER_SITE_OTHER): Payer: Medicare Other | Admitting: Cardiology

## 2022-01-21 ENCOUNTER — Encounter: Payer: Self-pay | Admitting: Cardiology

## 2022-01-21 ENCOUNTER — Other Ambulatory Visit: Payer: Self-pay

## 2022-01-21 VITALS — BP 132/80 | HR 61 | Ht 67.0 in | Wt 153.6 lb

## 2022-01-21 DIAGNOSIS — I10 Essential (primary) hypertension: Secondary | ICD-10-CM

## 2022-01-21 DIAGNOSIS — E785 Hyperlipidemia, unspecified: Secondary | ICD-10-CM

## 2022-01-21 DIAGNOSIS — I2510 Atherosclerotic heart disease of native coronary artery without angina pectoris: Secondary | ICD-10-CM | POA: Diagnosis not present

## 2022-01-22 ENCOUNTER — Other Ambulatory Visit: Payer: Self-pay | Admitting: Cardiology

## 2022-04-11 ENCOUNTER — Other Ambulatory Visit: Payer: Self-pay | Admitting: Cardiology

## 2022-09-06 ENCOUNTER — Other Ambulatory Visit: Payer: Self-pay | Admitting: Family Medicine

## 2022-09-06 DIAGNOSIS — Z1231 Encounter for screening mammogram for malignant neoplasm of breast: Secondary | ICD-10-CM

## 2022-10-05 ENCOUNTER — Ambulatory Visit: Payer: Medicare Other

## 2022-10-11 ENCOUNTER — Ambulatory Visit
Admission: RE | Admit: 2022-10-11 | Discharge: 2022-10-11 | Disposition: A | Discharge status: Home / Self Care | Payer: Medicare Other | Source: Ambulatory Visit | Attending: Family Medicine | Admitting: Family Medicine

## 2022-10-11 DIAGNOSIS — Z1231 Encounter for screening mammogram for malignant neoplasm of breast: Secondary | ICD-10-CM

## 2023-01-11 ENCOUNTER — Other Ambulatory Visit: Payer: Self-pay | Admitting: Cardiology

## 2023-04-12 ENCOUNTER — Other Ambulatory Visit: Payer: Self-pay | Admitting: Cardiology

## 2023-05-04 ENCOUNTER — Other Ambulatory Visit: Payer: Self-pay | Admitting: Cardiology

## 2023-06-09 NOTE — Progress Notes (Deleted)
Cardiology Clinic Note   Patient Name: Emily Villegas Date of Encounter: 06/09/2023  Primary Care Provider:  Sigmund Hazel, MD Primary Cardiologist:  Peter Swaziland, MD  Patient Profile    67 year old female with hx of NSTEMI, SCAD,(?)  Nonobstructive CAD, hypertension, hyperlipidemia,, hypertension.  Most recent cardiac catheterization in 2021 showed nonobstructive LAD stenosis tapering off of the distal circumflex suspicious for intramural hematoma likely secondary to SCAD.  Echocardiogram was normal with an EF of 60 to 65% and grade 1 diastolic dysfunction.  She had follow up CT head and neck which showed no evidence of fibromuscular dysplasia or aneurysmal disease. It did show indeterminate small vessel infarcts in the right hemisphere white matter (posterior right corona radiata), and in the left cerebellum. She has  seen neurology and MRI showed Scattered periventricular and subcortical chronic small vessel ischemic disease; chronic lacunar infarcts in cerebellum and right corona radiata. No acute findings.     Past Medical History    Past Medical History:  Diagnosis Date   Hyperlipidemia    Hypertension    Past Surgical History:  Procedure Laterality Date   LEFT HEART CATH AND CORONARY ANGIOGRAPHY N/A 04/21/2020   Procedure: LEFT HEART CATH AND CORONARY ANGIOGRAPHY;  Surgeon: Tonny Bollman, MD;  Location: [facility] INVASIVE CV LAB;  Service: Cardiovascular;  Laterality: N/A;    Allergies  No Known Allergies  History of Present Illness    ***  Home Medications    Current Outpatient Medications  Medication Sig Dispense Refill   atorvastatin (LIPITOR) 40 MG tablet TAKE 1 TABLET EVERY DAY 90 tablet 1   carvedilol (COREG) 12.5 MG tablet TAKE 1 TABLET BY MOUTH 2 TIMES A DAY. APPOINTMENT NEEDED FOR CONTINUATION OF REFILLS-1ST ATTEMPT 180 tablet 1   meloxicam (MOBIC) 7.5 MG tablet Take 1 tablet (7.5 mg total) by mouth daily. 90 tablet 1   nitroGLYCERIN (NITROSTAT) 0.4 MG SL  tablet Place 1 tablet (0.4 mg total) under the tongue every 5 (five) minutes as needed for chest pain. 25 tablet 1   No current facility-administered medications for this visit.     Family History    Family History  Problem Relation Age of Onset   Heart attack Father        late 79s   Breast cancer Niece 18   She indicated that her mother is alive. She indicated that her father is deceased. She indicated that her sister is alive. She indicated that her brother is alive. She indicated that her maternal grandmother is deceased. She indicated that her maternal grandfather is deceased. She indicated that her paternal grandmother is deceased. She indicated that her paternal grandfather is deceased. She indicated that her daughter is alive. She indicated that her son is alive. She indicated that the status of her niece is unknown.  Social History    Social History   Socioeconomic History   Marital status: Married    Spouse name: Casimiro Needle   Number of children: Not on file   Years of education: Not on file   Highest education level: Master's degree (e.g., MA, MS, MEng, MEd, MSW, MBA)  Occupational History   Not on file  Tobacco Use   Smoking status: Never   Smokeless tobacco: Never  Substance and Sexual Activity   Alcohol use: Yes    Alcohol/week: 7.0 standard drinks of alcohol    Types: 7 Glasses of wine per week    Comment: 1-2 drinks most days of the week   Drug use: No  Sexual activity: Not on file  Other Topics Concern   Not on file  Social History Narrative   Lives with husband   Caffeine- coffee 2 c daily   Social Determinants of Health   Financial Resource Strain: Not on file  Food Insecurity: Not on file  Transportation Needs: Not on file  Physical Activity: Not on file  Stress: Not on file  Social Connections: Not on file  Intimate Partner Violence: Not on file     Review of Systems    General:  No chills, fever, night sweats or weight changes.   Cardiovascular:  No chest pain, dyspnea on exertion, edema, orthopnea, palpitations, paroxysmal nocturnal dyspnea. Dermatological: No rash, lesions/masses Respiratory: No cough, dyspnea Urologic: No hematuria, dysuria Abdominal:   No nausea, vomiting, diarrhea, bright red blood per rectum, melena, or hematemesis Neurologic:  No visual changes, wkns, changes in mental status. All other systems reviewed and are otherwise negative except as noted above.       Physical Exam    VS:  There were no vitals taken for this visit. , BMI There is no height or weight on file to calculate BMI.     GEN: Well nourished, well developed, in no acute distress. HEENT: normal. Neck: Supple, no JVD, carotid bruits, or masses. Cardiac: RRR, no murmurs, rubs, or gallops. No clubbing, cyanosis, edema.  Radials/DP/PT 2+ and equal bilaterally.  Respiratory:  Respirations regular and unlabored, clear to auscultation bilaterally. GI: Soft, nontender, nondistended, BS + x 4. MS: no deformity or atrophy. Skin: warm and dry, no rash. Neuro:  Strength and sensation are intact. Psych: Normal affect.      Lab Results  Component Value Date   WBC 8.7 04/21/2020   HGB 12.4 04/21/2020   HCT 36.8 04/21/2020   MCV 94.4 04/21/2020   PLT 241 04/21/2020   Lab Results  Component Value Date   CREATININE 0.83 06/10/2020   BUN 12 06/10/2020   NA 137 06/10/2020   K 4.3 06/10/2020   CL 101 06/10/2020   CO2 23 06/10/2020   Lab Results  Component Value Date   ALT 16 04/22/2020   AST 24 04/22/2020   ALKPHOS 69 04/22/2020   BILITOT 0.9 04/22/2020   Lab Results  Component Value Date   CHOL 140 06/10/2020   HDL 61 06/10/2020   LDLCALC 69 06/10/2020   TRIG 44 06/10/2020   CHOLHDL 2.3 06/10/2020    Lab Results  Component Value Date   HGBA1C 5.6 04/21/2020     Review of Prior Studies    LEFT HEART CATH AND CORONARY ANGIOGRAPHY 04/21/20  Conclusion   1. Mild nonobstructive mid-LAD stenosis 2. Tapering of  the distal circumflex suspicious for intramural hematoma 3. Widely patent dominant RCA 4. Mild hypokinesis of the distal inferior wall/inferoapex with preserved overall LVEF of 55-65%   Recommend: medical therapy    Diagnostic Dominance: Right       Echo 04/21/20  1. Left ventricular ejection fraction, by estimation, is 60 to 65%. The  left ventricle has normal function. The left ventricle has no regional  wall motion abnormalities. Left ventricular diastolic parameters are  consistent with Grade I diastolic  dysfunction (impaired relaxation).   2. Right ventricular systolic function is normal. The right ventricular  size is normal. There is normal pulmonary artery systolic pressure. The  estimated right ventricular systolic pressure is 21.5 mmHg.   3. The mitral valve is normal in structure. No evidence of mitral valve  regurgitation. No evidence  of mitral stenosis.   4. The aortic valve is tricuspid. Aortic valve regurgitation is not  visualized. Mild aortic valve sclerosis is present, with no evidence of  aortic valve stenosis.   5. The inferior vena cava is normal in size with greater than 50%  respiratory variability, suggesting right atrial pressure of 3 mmHg.    CT ANGIOGRAPHY HEAD AND NECK   TECHNIQUE: Multidetector CT imaging of the head and neck was performed using the standard protocol during bolus administration of intravenous contrast. Multiplanar CT image reconstructions and MIPs were obtained to evaluate the vascular anatomy. Carotid stenosis measurements (when applicable) are obtained utilizing NASCET criteria, using the distal internal carotid diameter as the denominator.   CONTRAST:  75mL ISOVUE-370 IOPAMIDOL (ISOVUE-370) INJECTION 76%   COMPARISON:  None.   FINDINGS: CT HEAD   Brain: Cerebral volume is within normal limits for age. No midline shift, ventriculomegaly, mass effect, evidence of mass lesion, intracranial hemorrhage or evidence of  cortically based acute infarction.   Focal hypodensity in the posterior right corona radiata (series 3, image 20 and series 8, image 69) is age indeterminate but has a more chronic appearance.   There is also evidence of a small chronic infarct in the left cerebellum on series 3, image 9.   Elsewhere gray-white matter differentiation is within normal limits. No cortical encephalomalacia identified.   Calvarium and skull base: Negative.   Paranasal sinuses: Clear.   Orbits: Orbit and scalp soft tissues are within normal limits.   CTA NECK   Skeleton: No acute osseous abnormality identified. Mild for age cervical spine degeneration. Exaggerated upper thoracic kyphosis and mild levoconvex scoliosis.   Upper chest: Negative.   Other neck: Tiny hypodensities in the thyroid do not meet size criteria for follow-up (ref: J Am Coll Radiol. 2015 Feb;12(2): 143-50).The glottis is closed. Neck soft tissue contours are within normal limits. No lymphadenopathy.   Aortic arch: 3 vessel arch configuration.  No arch atherosclerosis.   Right carotid system: Negative.   Left carotid system: Negative; anatomic variation in the form of a arterial branch arising from the medial bifurcation with a small associated infundibulum (series 11, image 100). This is probably the ascending pharyngeal artery on that side.   Vertebral arteries: Negative. Fairly codominant vertebral arteries. There is some right V1 segment paravertebral venous contrast contamination.   CTA HEAD   Posterior circulation: Fairly codominant distal vertebral arteries are normal to the vertebrobasilar junction. Normal PICA origins. No stenosis. Patent vertebrobasilar junction and basilar artery without stenosis. Normal SCA and right PCA origins. Fetal type left PCA origin. Both posterior communicating arteries are present. Bilateral PCA branches are within normal limits.   Anterior circulation: Both ICA siphons are  patent. Trace if any siphon plaque. No ICA siphon stenosis. Normal ophthalmic and posterior communicating artery origins. Patent carotid termini. Normal MCA and ACA origins. Dominant right ACA A1 segment. Diminutive anterior communicating artery. The right A2 and distal branches are also dominant. Left MCA M1 segment bifurcates early without stenosis. Left MCA branches are within normal limits. Right MCA M1 segment and trifurcation are patent without stenosis. Right MCA branches are within normal limits.   Venous sinuses: Patent. Dominant right transverse and sigmoid sinuses. Normal arachnoid granulation at the junction of the left transverse and sigmoid.   Anatomic variants: Small innominate arterial branch arising from the left carotid bifurcation with an infundibulum. Fetal type left PCA origin. Dominant right ACA. Dominant right transverse and sigmoid venous sinuses.   Review of  the MIP images confirms the above findings     Assessment & Plan   1.  ***     {Are you ordering a CV Procedure (e.g. stress test, cath, DCCV, TEE, etc)?   Press F2        :[id number]}   Signed, Bettey Mare. Liborio Nixon, ANP, AACC   06/09/2023 12:50 PM      Office [phone] Fax [phone]  Notice: This dictation was prepared with Dragon dictation along with smaller phrase technology. Any transcriptional errors that result from this process are unintentional and may not be corrected upon review.

## 2023-06-12 ENCOUNTER — Ambulatory Visit: Payer: Medicare Other | Admitting: Adult Health

## 2023-07-08 NOTE — Progress Notes (Unsigned)
Cardiology Clinic Note   Patient Name: Emily Villegas Date of Encounter: 07/10/2023  Primary Care Provider:  Sigmund Hazel, MD Primary Cardiologist:  Peter Swaziland, MD  Patient Profile    67 year old female with history of NSTEMI with questionable SCAD on 04/16/2020, with cardiac catheterization revealing mild nonobstructive mid LAD stenosis and tapering of the distal circumflex suspicious for intramural hematoma likely secondary to SCAD,  Hypertension,   She had follow up CT head and neck which showed no evidence of fibromuscular dysplasia or aneurysmal disease. It did show indeterminate small vessel infarcts in the right hemisphere white matter (posterior right corona radiata), and in the left cerebellum last seen by Dr. Swaziland on 01/21/2022 and was stable from a cardiac standpoint without any cardiac complaints.  Past Medical History    Past Medical History:  Diagnosis Date   Hyperlipidemia    Hypertension    Past Surgical History:  Procedure Laterality Date   LEFT HEART CATH AND CORONARY ANGIOGRAPHY N/A 04/21/2020   Procedure: LEFT HEART CATH AND CORONARY ANGIOGRAPHY;  Surgeon: Tonny Bollman, MD;  Location: [facility] INVASIVE CV LAB;  Service: Cardiovascular;  Laterality: N/A;    Allergies  No Known Allergies  History of Present Illness    Emily Villegas returns today for ongoing assessment and management of coronary artery disease with non-STEMI in May 2021, likely as CAD of the distal circumflex, hyperlipidemia, and hypertension.  She comes today without any cardiac complaints.  She states that she is out of shape and would like to exercise more.  She does intermittently exercise by walking.  In fact yesterday she did walk 5 miles on the [place].  But this is very sparse concerning frequency.  She has some bone spurs in her left hip which are causing her to have pain along with joint pain in both of her knees.  Labs are followed by PCP through [facility] primary care, and she has had  all of her refills through the them as well.  She did have cholesterol studies in May 2024.  Total cholesterol was 158, HDL 85, LDL 58.  Home Medications    Current Outpatient Medications  Medication Sig Dispense Refill   atorvastatin (LIPITOR) 40 MG tablet TAKE 1 TABLET EVERY DAY 90 tablet 1   carvedilol (COREG) 12.5 MG tablet TAKE 1 TABLET BY MOUTH 2 TIMES A DAY. APPOINTMENT NEEDED FOR CONTINUATION OF REFILLS-1ST ATTEMPT 180 tablet 1   Cyanocobalamin (VITAMIN B 12 PO) Take by mouth.     losartan (COZAAR) 25 MG tablet Take 25 mg by mouth daily.     meloxicam (MOBIC) 7.5 MG tablet Take 1 tablet (7.5 mg total) by mouth daily. 90 tablet 1   nitroGLYCERIN (NITROSTAT) 0.4 MG SL tablet Place 1 tablet (0.4 mg total) under the tongue every 5 (five) minutes as needed for chest pain. (Patient not taking: Reported on 07/10/2023) 25 tablet 1   No current facility-administered medications for this visit.     Family History    Family History  Problem Relation Age of Onset   Heart attack Father        late 62s   Breast cancer Niece 75   She indicated that her mother is alive. She indicated that her father is deceased. She indicated that her sister is alive. She indicated that her brother is alive. She indicated that her maternal grandmother is deceased. She indicated that her maternal grandfather is deceased. She indicated that her paternal grandmother is deceased. She indicated that her paternal grandfather is  deceased. She indicated that her daughter is alive. She indicated that her son is alive. She indicated that the status of her niece is unknown.  Social History    Social History   Socioeconomic History   Marital status: Married    Spouse name: Casimiro Needle   Number of children: Not on file   Years of education: Not on file   Highest education level: Master's degree (e.g., MA, MS, MEng, MEd, MSW, MBA)  Occupational History   Not on file  Tobacco Use   Smoking status: Never   Smokeless  tobacco: Never  Substance and Sexual Activity   Alcohol use: Yes    Alcohol/week: 7.0 standard drinks of alcohol    Types: 7 Glasses of wine per week    Comment: 1-2 drinks most days of the week   Drug use: No   Sexual activity: Not on file  Other Topics Concern   Not on file  Social History Narrative   Lives with husband   Caffeine- coffee 2 c daily   Social Determinants of Health   Financial Resource Strain: Not on file  Food Insecurity: Not on file  Transportation Needs: Not on file  Physical Activity: Not on file  Stress: Not on file  Social Connections: Not on file  Intimate Partner Violence: Not on file     Review of Systems    General:  No chills, fever, night sweats or weight changes.  Cardiovascular:  No chest pain, dyspnea on exertion, edema, orthopnea, palpitations, paroxysmal nocturnal dyspnea. Dermatological: No rash, lesions/masses Respiratory: No cough, dyspnea Urologic: No hematuria, dysuria Abdominal:   No nausea, vomiting, diarrhea, bright red blood per rectum, melena, or hematemesis Neurologic:  No visual changes, wkns, changes in mental status. All other systems reviewed and are otherwise negative except as noted above.  EKG Interpretation Date/Time:  Monday July 10 2023 10:25:14 EDT Ventricular Rate:  59 PR Interval:  156 QRS Duration:  96 QT Interval:  408 QTC Calculation: 403 R Axis:   83  Text Interpretation: Sinus bradycardia Incomplete right bundle branch block When compared with ECG of 21-Apr-2020 01:14, PREVIOUS ECG IS PRESENT Confirmed by Joni Reining [phone]) on 07/10/2023 10:45:58 AM    Physical Exam    VS:  BP 128/70 (BP Location: Left Arm, Patient Position: Sitting, Cuff Size: Normal)   Pulse 62   Ht 5' 7.5" (1.715 m)   Wt 152 lb 3.2 oz (69 kg)   SpO2 98%   BMI 23.49 kg/m  , BMI Body mass index is 23.49 kg/m.     GEN: Well nourished, well developed, in no acute distress. HEENT: normal. Neck: Supple, no JVD, carotid  bruits, or masses. Cardiac: RRR, 2/6 systolic murmurs, no rubs, or gallops. No clubbing, cyanosis, edema.  Radials/DP/PT 2+ and equal bilaterally.  Respiratory:  Respirations regular and unlabored, clear to auscultation bilaterally. GI: Soft, nontender, nondistended, BS + x 4. MS: no deformity or atrophy. Skin: warm and dry, no rash. Neuro:  Strength and sensation are intact. Psych: Normal affect.  EKG Interpretation Date/Time:  Monday July 10 2023 10:25:14 EDT Ventricular Rate:  59 PR Interval:  156 QRS Duration:  96 QT Interval:  408 QTC Calculation: 403 R Axis:   83  Text Interpretation: Sinus bradycardia Incomplete right bundle branch block When compared with ECG of 21-Apr-2020 01:14, PREVIOUS ECG IS PRESENT Confirmed by Joni Reining [phone]) on 07/10/2023 10:45:58 AM   Lab Results  Component Value Date   WBC 8.7 04/21/2020   HGB  12.4 04/21/2020   HCT 36.8 04/21/2020   MCV 94.4 04/21/2020   PLT 241 04/21/2020   Lab Results  Component Value Date   CREATININE 0.83 06/10/2020   BUN 12 06/10/2020   NA 137 06/10/2020   K 4.3 06/10/2020   CL 101 06/10/2020   CO2 23 06/10/2020   Lab Results  Component Value Date   ALT 16 04/22/2020   AST 24 04/22/2020   ALKPHOS 69 04/22/2020   BILITOT 0.9 04/22/2020   Lab Results  Component Value Date   CHOL 140 06/10/2020   HDL 61 06/10/2020   LDLCALC 69 06/10/2020   TRIG 44 06/10/2020   CHOLHDL 2.3 06/10/2020    Lab Results  Component Value Date   HGBA1C 5.6 04/21/2020     Review of Prior Studies EKG Interpretation Date/Time:  Monday July 10 2023 10:25:14 EDT Ventricular Rate:  59 PR Interval:  156 QRS Duration:  96 QT Interval:  408 QTC Calculation: 403 R Axis:   83  Text Interpretation: Sinus bradycardia Incomplete right bundle branch block When compared with ECG of 21-Apr-2020 01:14, PREVIOUS ECG IS PRESENT Confirmed by Joni Reining [phone]) on 07/10/2023 10:45:58 AM   LEFT HEART CATH AND CORONARY  ANGIOGRAPHY 04/21/20  Conclusion   1. Mild nonobstructive mid-LAD stenosis 2. Tapering of the distal circumflex suspicious for intramural hematoma 3. Widely patent dominant RCA 4. Mild hypokinesis of the distal inferior wall/inferoapex with preserved overall LVEF of 55-65%   Recommend: medical therapy    Diagnostic Dominance: Right       Echo 04/21/20  1. Left ventricular ejection fraction, by estimation, is 60 to 65%. The  left ventricle has normal function. The left ventricle has no regional  wall motion abnormalities. Left ventricular diastolic parameters are  consistent with Grade I diastolic  dysfunction (impaired relaxation).   2. Right ventricular systolic function is normal. The right ventricular  size is normal. There is normal pulmonary artery systolic pressure. The  estimated right ventricular systolic pressure is 21.5 mmHg.   3. The mitral valve is normal in structure. No evidence of mitral valve  regurgitation. No evidence of mitral stenosis.   4. The aortic valve is tricuspid. Aortic valve regurgitation is not  visualized. Mild aortic valve sclerosis is present, with no evidence of  aortic valve stenosis.   5. The inferior vena cava is normal in size with greater than 50%  respiratory variability, suggesting right atrial pressure of 3 mmHg.    CT ANGIOGRAPHY HEAD AND NECK   TECHNIQUE: Multidetector CT imaging of the head and neck was performed using the standard protocol during bolus administration of intravenous contrast. Multiplanar CT image reconstructions and MIPs were obtained to evaluate the vascular anatomy. Carotid stenosis measurements (when applicable) are obtained utilizing NASCET criteria, using the distal internal carotid diameter as the denominator.   CONTRAST:  75mL ISOVUE-370 IOPAMIDOL (ISOVUE-370) INJECTION 76%   COMPARISON:  None.   FINDINGS: CT HEAD   Brain: Cerebral volume is within normal limits for age. No midline shift,  ventriculomegaly, mass effect, evidence of mass lesion, intracranial hemorrhage or evidence of cortically based acute infarction.   Focal hypodensity in the posterior right corona radiata (series 3, image 20 and series 8, image 31) is age indeterminate but has a more chronic appearance.   There is also evidence of a small chronic infarct in the left cerebellum on series 3, image 9.   Elsewhere gray-white matter differentiation is within normal limits. No cortical encephalomalacia identified.  Calvarium and skull base: Negative.   Paranasal sinuses: Clear.   Orbits: Orbit and scalp soft tissues are within normal limits.   CTA NECK   Skeleton: No acute osseous abnormality identified. Mild for age cervical spine degeneration. Exaggerated upper thoracic kyphosis and mild levoconvex scoliosis.   Upper chest: Negative.   Other neck: Tiny hypodensities in the thyroid do not meet size criteria for follow-up (ref: J Am Coll Radiol. 2015 Feb;12(2): 143-50).The glottis is closed. Neck soft tissue contours are within normal limits. No lymphadenopathy.   Aortic arch: 3 vessel arch configuration.  No arch atherosclerosis.   Right carotid system: Negative.   Left carotid system: Negative; anatomic variation in the form of a arterial branch arising from the medial bifurcation with a small associated infundibulum (series 11, image 100). This is probably the ascending pharyngeal artery on that side.   Vertebral arteries: Negative. Fairly codominant vertebral arteries. There is some right V1 segment paravertebral venous contrast contamination.   CTA HEAD   Posterior circulation: Fairly codominant distal vertebral arteries are normal to the vertebrobasilar junction. Normal PICA origins. No stenosis. Patent vertebrobasilar junction and basilar artery without stenosis. Normal SCA and right PCA origins. Fetal type left PCA origin. Both posterior communicating arteries are present.  Bilateral PCA branches are within normal limits.   Anterior circulation: Both ICA siphons are patent. Trace if any siphon plaque. No ICA siphon stenosis. Normal ophthalmic and posterior communicating artery origins. Patent carotid termini. Normal MCA and ACA origins. Dominant right ACA A1 segment. Diminutive anterior communicating artery. The right A2 and distal branches are also dominant. Left MCA M1 segment bifurcates early without stenosis. Left MCA branches are within normal limits. Right MCA M1 segment and trifurcation are patent without stenosis. Right MCA branches are within normal limits.   Venous sinuses: Patent. Dominant right transverse and sigmoid sinuses. Normal arachnoid granulation at the junction of the left transverse and sigmoid.   Anatomic variants: Small innominate arterial branch arising from the left carotid bifurcation with an infundibulum. Fetal type left PCA origin. Dominant right ACA. Dominant right transverse and sigmoid venous sinuses.   Review of the MIP images confirms the above findings   IMPRESSION: 1. No atherosclerosis or stenosis on CTA head and neck.   2. However, there are age indeterminate small vessel infarcts in the right hemisphere white matter (posterior right corona radiata), and in the left cerebellum. The former could be the symptomatic lesion with respect to left side paresthesia. No intracranial hemorrhage or mass effect.     Electronically Signed   By: Odessa Fleming M.D.   On: 06/29/2020 13:00  Assessment & Plan   1.  Coronary artery disease: Nonobstructive mid LAD stenosis, widely patent dominant RCA.  Continue secondary prevention with blood pressure control, statin therapy, purposeful exercise, and low-cholesterol diet.  She is completely asymptomatic.  2.  Hypercholesterolemia: Remains on atorvastatin 40 mg daily.  She would like to try decreasing the dose to 20 mg because of her aches and pains in her legs.  I told her most  often these aches and pains are from in the muscles and not the joints or she is experiencing at the most.  I did warn her that her levels will probably go up if she were to decrease her dose of atorvastatin for over 6 months.  Would like to keep the LDL less than 70 with known history of CAD.  I have given her a copy of her most recent labs from [facility] physicians  that she is unable to get into the port. I did obtain them through "care everywhere"  3.  Hypertension: Blood pressure is well-controlled on current medication regimen losartan 25 mg daily.  Review of recent labs shows normal kidney function.  She is given a report of her lab which I printed out.       Signed, Bettey Mare. Liborio Nixon, ANP, AACC   07/10/2023 10:53 AM      Office [phone] Fax [phone]  Notice: This dictation was prepared with Dragon dictation along with smaller phrase technology. Any transcriptional errors that result from this process are unintentional and may not be corrected upon review.

## 2023-07-10 ENCOUNTER — Encounter: Payer: Self-pay | Admitting: Adult Health

## 2023-07-10 ENCOUNTER — Ambulatory Visit: Payer: Medicare Other | Attending: Adult Health | Admitting: Adult Health

## 2023-07-10 VITALS — BP 128/70 | HR 62 | Ht 67.5 in | Wt 152.2 lb

## 2023-07-10 DIAGNOSIS — I10 Essential (primary) hypertension: Secondary | ICD-10-CM | POA: Diagnosis present

## 2023-07-10 MED ORDER — MELOXICAM 7.5 MG PO TABS: 7.5000 mg | ORAL_TABLET | Freq: Every day | ORAL | 1 refills | Status: AC

## 2023-07-10 NOTE — Patient Instructions (Addendum)
Medication Instructions:  No Changes *If you need a refill on your cardiac medications before your next appointment, please call your pharmacy*   Lab Work: No Labs If you have labs (blood work) drawn today and your tests are completely normal, you will receive your results only by: MyChart Message (if you have MyChart) OR A paper copy in the mail If you have any lab test that is abnormal or we need to change your treatment, we will call you to review the results.   Testing/Procedures: No Testing   Follow-Up: At [facility], you and your health needs are our priority.  As part of our continuing mission to provide you with exceptional heart care, we have created designated Provider Care Teams.  These Care Teams include your primary Cardiologist (physician) and Advanced Practice Providers (APPs -  Physician Assistants and Nurse Practitioners) who all work together to provide you with the care you need, when you need it.  We recommend signing up for the patient portal called "MyChart".  Sign up information is provided on this After Visit Summary.  MyChart is used to connect with patients for Virtual Visits (Telemedicine).  Patients are able to view lab/test results, encounter notes, upcoming appointments, etc.  Non-urgent messages can be sent to your provider as well.   To learn more about what you can do with MyChart, go to [email or web address].    Your next appointment:   1 year(s)  Provider:   Peter Jordan, MD   

## 2023-08-22 ENCOUNTER — Other Ambulatory Visit: Payer: Self-pay | Admitting: Cardiology

## 2023-10-12 ENCOUNTER — Other Ambulatory Visit: Payer: Self-pay | Admitting: Family Medicine

## 2023-10-12 DIAGNOSIS — Z1231 Encounter for screening mammogram for malignant neoplasm of breast: Secondary | ICD-10-CM

## 2023-11-09 ENCOUNTER — Ambulatory Visit
Admission: RE | Admit: 2023-11-09 | Discharge: 2023-11-09 | Disposition: A | Discharge status: Home / Self Care | Payer: Medicare Other | Source: Ambulatory Visit | Attending: Family Medicine | Admitting: Family Medicine

## 2023-11-09 DIAGNOSIS — Z1231 Encounter for screening mammogram for malignant neoplasm of breast: Secondary | ICD-10-CM

## 2023-12-17 ENCOUNTER — Telehealth: Payer: Self-pay | Admitting: Internal Medicine

## 2023-12-17 NOTE — Telephone Encounter (Signed)
Patient contacted the cardiology after hours line to report her blood pressure being elevated.  Recent blood pressure was 152/92 mmHg.  Patient reports taking all of her blood pressure medications.  Informed patient if it remains elevated, she can call to set up an appointment for further management.

## 2024-02-26 ENCOUNTER — Telehealth: Payer: Self-pay | Admitting: Cardiology

## 2024-02-26 NOTE — Telephone Encounter (Signed)
 I have attempted to contact this patient by phone about pre-op clearance but the phone rang and hung up. Will try to call again later.

## 2024-02-26 NOTE — Telephone Encounter (Signed)
   Pre-operative Risk Assessment    Patient Name: Emily Villegas  DOB: Dec 08, 1955 MRN: [id number]      Request for Surgical Clearance    Procedure:  Left total hip   Date of Surgery:  Clearance TBD                                 Surgeon:  Dr Jodi Geralds    Surgeon's Group or Practice Name:  [facility]  Phone number:  [phone]  Fax number:  [phone]   Type of Clearance Requested:   Both but unclear about what med    Type of Anesthesia:  Spinal   Additional requests/questions:    Signed, Edson Snowball A Stovall   02/26/2024, 10:50 AM

## 2024-02-26 NOTE — Telephone Encounter (Signed)
   Name: Emily Villegas  DOB: March 20, 1956  MRN: [id number]  Primary Cardiologist: Peter Swaziland, MD  Chart reviewed as part of pre-operative protocol coverage. Because of Emily Villegas past medical history and time since last visit, she will require a follow-up telephone visit in order to better assess preoperative cardiovascular risk.  Pre-op covering staff: - Please schedule appointment and call patient to inform them. If patient already had an upcoming appointment within acceptable timeframe, please add "pre-op clearance" to the appointment notes so provider is aware. - Please contact requesting surgeon's office via preferred method (i.e, phone, fax) to inform them of need for appointment prior to surgery.  I also confirmed the patient resides in the state of [place]. As per [place] [employer] telemedicine laws, the patient must reside in the state in which the provider is licensed    She is not on any blood thinners.   Sharlene Dory, PA-C  02/26/2024, 11:36 AM

## 2024-03-01 ENCOUNTER — Telehealth: Payer: Self-pay

## 2024-03-01 NOTE — Telephone Encounter (Signed)
 Pt returning call

## 2024-03-01 NOTE — Telephone Encounter (Signed)
 Follow Up:       Patient is calling back to schedule phone visit for pre-op.Emily Villegas

## 2024-03-01 NOTE — Telephone Encounter (Signed)
 Preop televisit scheduled, med rec and consent done

## 2024-03-01 NOTE — Telephone Encounter (Signed)
 Called patient, Na, LMAM to call back to scheduled tele visit pre-op clearance appointment.

## 2024-03-01 NOTE — Telephone Encounter (Signed)
  Patient Consent for Virtual Visit        Emily Villegas has provided verbal consent on 03/01/2024 for a virtual visit (video or telephone).   CONSENT FOR VIRTUAL VISIT FOR:  Emily Villegas  By participating in this virtual visit I agree to the following:  I hereby voluntarily request, consent and authorize [facility] and its employed or contracted physicians, physician assistants, nurse practitioners or other licensed health care professionals (the Practitioner), to provide me with telemedicine health care services (the "Services") as deemed necessary by the treating Practitioner. I acknowledge and consent to receive the Services by the Practitioner via telemedicine. I understand that the telemedicine visit will involve communicating with the Practitioner through live audiovisual communication technology and the disclosure of certain medical information by electronic transmission. I acknowledge that I have been given the opportunity to request an in-person assessment or other available alternative prior to the telemedicine visit and am voluntarily participating in the telemedicine visit.  I understand that I have the right to withhold or withdraw my consent to the use of telemedicine in the course of my care at any time, without affecting my right to future care or treatment, and that the Practitioner or I may terminate the telemedicine visit at any time. I understand that I have the right to inspect all information obtained and/or recorded in the course of the telemedicine visit and may receive copies of available information for a reasonable fee.  I understand that some of the potential risks of receiving the Services via telemedicine include:  Delay or interruption in medical evaluation due to technological equipment failure or disruption; Information transmitted may not be sufficient (e.g. poor resolution of images) to allow for appropriate medical decision making by the  Practitioner; and/or  In rare instances, security protocols could fail, causing a breach of personal health information.  Furthermore, I acknowledge that it is my responsibility to provide information about my medical history, conditions and care that is complete and accurate to the best of my ability. I acknowledge that Practitioner's advice, recommendations, and/or decision may be based on factors not within their control, such as incomplete or inaccurate data provided by me or distortions of diagnostic images or specimens that may result from electronic transmissions. I understand that the practice of medicine is not an exact science and that Practitioner makes no warranties or guarantees regarding treatment outcomes. I acknowledge that a copy of this consent can be made available to me via my patient portal [facility] MyChart), or I can request a printed copy by calling the office of [facility].    I understand that my insurance will be billed for this visit.   I have read or had this consent read to me. I understand the contents of this consent, which adequately explains the benefits and risks of the Services being provided via telemedicine.  I have been provided ample opportunity to ask questions regarding this consent and the Services and have had my questions answered to my satisfaction. I give my informed consent for the services to be provided through the use of telemedicine in my medical care

## 2024-03-20 ENCOUNTER — Ambulatory Visit: Attending: Cardiovascular Disease | Admitting: Nurse Practitioner

## 2024-03-20 DIAGNOSIS — Z01810 Encounter for preprocedural cardiovascular examination: Secondary | ICD-10-CM | POA: Insufficient documentation

## 2024-03-20 NOTE — Progress Notes (Signed)
 Virtual Visit via Telephone Note   Because of Emily Villegas co-morbid illnesses, she is at least at moderate risk for complications without adequate follow up.  This format is felt to be most appropriate for this patient at this time.  Due to technical limitations with video connection (technology), today's appointment will be conducted as an audio only telehealth visit, and [name] verbally agreed to proceed in this manner.   All issues noted in this document were discussed and addressed.  No physical exam could be performed with this format.  Evaluation Performed:  Preoperative cardiovascular risk assessment _____________   Date:  03/20/2024   Patient ID:  Emily Villegas, DOB [birth year], MRN [id number] Patient Location:  Home Provider location:   Office  Primary Care Provider:  Perley Bradley, MD Primary Cardiologist:  Peter [place], MD  Chief Complaint / Patient Profile   68 y.o. y/o female with a h/o nonobstructive CAD, s/p NSTEMI with questionable SCAD in 2021, hypertension, and hyperlipidemia who is pending L total hip arthroplasty with Dr. Neil Balls of [facility] and presents today for telephonic preoperative cardiovascular risk assessment.  History of Present Illness    Emily Villegas is a 68 y.o. female who presents via audio/video conferencing for a telehealth visit today.  Pt was last seen in cardiology clinic on 07/10/2023 by Friddie Jetty, NP. At that time Markie Heffernan was doing well.  The patient is now pending procedure as outlined above. Since her last visit, she has done well from a cardiac standpoint.   She denies chest pain, palpitations, dyspnea, pnd, orthopnea, n, v, dizziness, syncope, edema, weight gain, or early satiety. All other systems reviewed and are otherwise negative except as noted above.   Past Medical History    Past Medical History:  Diagnosis Date   Hyperlipidemia    Hypertension    Past Surgical History:   Procedure Laterality Date   LEFT HEART CATH AND CORONARY ANGIOGRAPHY N/A 04/21/2020   Procedure: LEFT HEART CATH AND CORONARY ANGIOGRAPHY;  Surgeon: Arnoldo Lapping, MD;  Location: [facility] INVASIVE CV LAB;  Service: Cardiovascular;  Laterality: N/A;    Allergies  No Known Allergies  Home Medications    Prior to Admission medications   Medication Sig Start Date End Date Taking? Authorizing Provider  atorvastatin  (LIPITOR ) 40 MG tablet TAKE 1 TABLET DAILY 08/23/23   [place], Peter M, MD  carvedilol  (COREG ) 12.5 MG tablet TAKE 1 TABLET BY MOUTH 2 TIMES A DAY. APPOINTMENT NEEDED FOR CONTINUATION OF REFILLS-1ST ATTEMPT 08/23/23   [place], Peter M, MD  Cyanocobalamin (VITAMIN B 12 PO) Take by mouth.    [provider]  losartan  (COZAAR ) 25 MG tablet Take 25 mg by mouth daily. 06/19/23   [provider]  meloxicam  (MOBIC ) 7.5 MG tablet Take 1 tablet (7.5 mg total) by mouth daily. 07/10/23   Tania Familia, NP    Physical Exam    Vital Signs:  Jaiden Wahab does not have vital signs available for review today.  Given telephonic nature of communication, physical exam is limited. AAOx3. NAD. Normal affect.  Speech and respirations are unlabored.  Accessory Clinical Findings    None  Assessment & Plan    1.  Preoperative Cardiovascular Risk Assessment:  According to the Revised Cardiac Risk Index (RCRI), her Perioperative Risk of Major Cardiac Event is (%): 0.9. Her Functional Capacity in METs is: 7.01 according to the Duke Activity Status Index (DASI).Therefore, based on ACC/AHA guidelines, patient would be at acceptable risk for the  planned procedure without further cardiovascular testing.   The patient was advised that if she develops new symptoms prior to surgery to contact our office to arrange for a follow-up visit, and she verbalized understanding.  A copy of this note will be routed to requesting surgeon.  Time:   Today, I have spent 5 minutes with the patient  with telehealth technology discussing medical history, symptoms, and management plan.     Jude Norton, NP  03/20/2024, 2:51 PM

## 2024-06-05 ENCOUNTER — Other Ambulatory Visit: Payer: Self-pay | Admitting: Cardiology

## 2024-07-09 ENCOUNTER — Ambulatory Visit: Admitting: Cardiology

## 2024-08-09 NOTE — Progress Notes (Signed)
 Cardiology Office Note   Date:  08/15/2024   ID:  Emily Villegas, DOB May 22, 1956, MRN [id number]  PCP:  Cleotilde Planas, MD  Cardiologist:  Eashan Schipani [place], MD EP: None  Chief Complaint  Patient presents with   Coronary Artery Disease      History of Present Illness: Emily Villegas is a 68 y.o. female with PMH of NSTEMI 2/2 ?SCAD, non-obstructive CAD, HTN, and HLD who presents for  follow-up.  She was admitted to [facility] from 04/20/20-04/22/20 after presenting with chest pain and severe HTN.  HsTroponins were elevated (peaked at 4359) and EKG showed non-specific T wave abnormalities. She underwent a cardiac catheterization which showed mild non-obstructive mLAD stenosis and tapering of the distal circumflex suspicious for intramural hematoma likely 2/2 SCAD. Vessels were quite tortuous.  Echo showed EF 60-65%, G1DD, and no significant valvular abnormalities. She was started on aspirin , statin, BBlocker. On amlodipine  for HTN.   She had follow up CT head and neck which showed no evidence of fibromuscular dysplasia or aneurysmal disease. It did show indeterminate small vessel infarcts in the right hemisphere white matter (posterior right corona radiata), and in the left cerebellum. She has  seen neurology and MRI showed Scattered periventricular and subcortical chronic small vessel ischemic disease; chronic lacunar infarcts in cerebellum and right corona radiata. No acute findings.   On follow up today she is doing well. Denies any recurrent chest pain. No palpitations. Tolerating medication well.  She did undergo THR earlier this year and still hasn't gotten up to normal exercise yet. Notes last lipid panel not at goal so switched to atorvastatin  but can't remember dose.    Past Medical History:  Diagnosis Date   Hyperlipidemia    Hypertension     Past Surgical History:  Procedure Laterality Date   LEFT HEART CATH AND CORONARY ANGIOGRAPHY N/A 04/21/2020   Procedure: LEFT HEART  CATH AND CORONARY ANGIOGRAPHY;  Surgeon: Wonda Sharper, MD;  Location: [facility] INVASIVE CV LAB;  Service: Cardiovascular;  Laterality: N/A;     Current Outpatient Medications  Medication Sig Dispense Refill   amoxicillin (AMOXIL) 500 MG capsule as directed. For dental procedures     carvedilol  (COREG ) 12.5 MG tablet TAKE 1 TABLET BY MOUTH 2 TIMES A DAY. APPOINTMENT NEEDED FOR CONTINUATION OF REFILLS-1ST ATTEMPT 180 tablet 1   Cholecalciferol (VITAMIN D) 50 MCG (2000 UT) tablet Take 2,000 Units by mouth daily.     Cyanocobalamin (VITAMIN B 12 PO) Take by mouth.     Emollient (DERMEND FRAGILE SKIN) CREA daily at 6 (six) AM.     losartan  (COZAAR ) 50 MG tablet Take 50 mg by mouth 2 (two) times daily.     rosuvastatin (CRESTOR) 40 MG tablet Take 40 mg by mouth daily.     meloxicam  (MOBIC ) 7.5 MG tablet Take 1 tablet (7.5 mg total) by mouth daily. (Patient not taking: Reported on 08/15/2024) 90 tablet 1   No current facility-administered medications for this visit.    Allergies:   Patient has no known allergies.    Social History:  The patient  reports that she has never smoked. She has never used smokeless tobacco. She reports current alcohol use of about 7.0 standard drinks of alcohol per week. She reports that she does not use drugs.   Family History:  The patient's family history includes Breast cancer (age of onset: 49) in her niece; Heart attack in her father.    ROS:  Please see the history of present illness.  Otherwise, review of systems are positive for none.   All other systems are reviewed and negative.    PHYSICAL EXAM: VS:  BP 129/84   Pulse 65   Ht 5' 8 (1.727 m)   Wt 155 lb (70.3 kg)   SpO2 96%   BMI 23.57 kg/m  , BMI Body mass index is 23.57 kg/m. GEN: Well nourished, well developed, in no acute distress HEENT: sclera anicteric Neck: no JVD, carotid bruits, or masses Cardiac: RRR; no murmurs, rubs, or gallops, no edema  Respiratory:  clear to auscultation  bilaterally, normal work of breathing GI: soft, nontender, nondistended, + BS MS: no deformity or atrophy Skin: warm and dry, no rash Neuro:  Strength and sensation are intact Psych: euthymic mood, full affect   EKG Interpretation Date/Time:  Thursday August 15 2024 15:21:50 EDT Ventricular Rate:  65 PR Interval:  148 QRS Duration:  86 QT Interval:  396 QTC Calculation: 411 R Axis:   -49  Text Interpretation: Sinus rhythm with Premature supraventricular complexes Left axis deviation When compared with ECG of 10-Jul-2023 10:25, Premature supraventricular complexes are now Present QRS axis Shifted left Confirmed by [place], Marjarie Irion [phone]) on 08/15/2024 3:30:37 PM      Recent Labs: No results found for requested labs within last 365 days.    Lipid Panel    Component Value Date/Time   CHOL 140 06/10/2020 0822   TRIG 44 06/10/2020 0822   HDL 61 06/10/2020 0822   CHOLHDL 2.3 06/10/2020 0822   CHOLHDL 2.7 04/21/2020 0259   VLDL 9 04/21/2020 0259   LDLCALC 69 06/10/2020 0822   Dated 02/05/21: cholesterol 153, triglycerides 72, HDL 50, LDL 88. CBC and CMET normal  Wt Readings from Last 3 Encounters:  08/15/24 155 lb (70.3 kg)  07/10/23 152 lb 3.2 oz (69 kg)  01/21/22 153 lb 9.6 oz (69.7 kg)      Other studies Reviewed: Additional studies/ records that were reviewed today include:   LEFT HEART CATH AND CORONARY ANGIOGRAPHY 04/21/20  Conclusion   1. Mild nonobstructive mid-LAD stenosis 2. Tapering of the distal circumflex suspicious for intramural hematoma 3. Widely patent dominant RCA 4. Mild hypokinesis of the distal inferior wall/inferoapex with preserved overall LVEF of 55-65%   Recommend: medical therapy    Diagnostic Dominance: Right       Echo 04/21/20  1. Left ventricular ejection fraction, by estimation, is 60 to 65%. The  left ventricle has normal function. The left ventricle has no regional  wall motion abnormalities. Left ventricular diastolic  parameters are  consistent with Grade I diastolic  dysfunction (impaired relaxation).   2. Right ventricular systolic function is normal. The right ventricular  size is normal. There is normal pulmonary artery systolic pressure. The  estimated right ventricular systolic pressure is 21.5 mmHg.   3. The mitral valve is normal in structure. No evidence of mitral valve  regurgitation. No evidence of mitral stenosis.   4. The aortic valve is tricuspid. Aortic valve regurgitation is not  visualized. Mild aortic valve sclerosis is present, with no evidence of  aortic valve stenosis.   5. The inferior vena cava is normal in size with greater than 50%  respiratory variability, suggesting right atrial pressure of 3 mmHg.   CT ANGIOGRAPHY HEAD AND NECK   TECHNIQUE: Multidetector CT imaging of the head and neck was performed using the standard protocol during bolus administration of intravenous contrast. Multiplanar CT image reconstructions and MIPs were obtained to evaluate the vascular anatomy. Carotid  stenosis measurements (when applicable) are obtained utilizing NASCET criteria, using the distal internal carotid diameter as the denominator.   CONTRAST:  75mL ISOVUE -370 IOPAMIDOL  (ISOVUE -370) INJECTION 76%   COMPARISON:  None.   FINDINGS: CT HEAD   Brain: Cerebral volume is within normal limits for age. No midline shift, ventriculomegaly, mass effect, evidence of mass lesion, intracranial hemorrhage or evidence of cortically based acute infarction.   Focal hypodensity in the posterior right corona radiata (series 3, image 20 and series 8, image 64) is age indeterminate but has a more chronic appearance.   There is also evidence of a small chronic infarct in the left cerebellum on series 3, image 9.   Elsewhere gray-white matter differentiation is within normal limits. No cortical encephalomalacia identified.   Calvarium and skull base: Negative.   Paranasal sinuses: Clear.    Orbits: Orbit and scalp soft tissues are within normal limits.   CTA NECK   Skeleton: No acute osseous abnormality identified. Mild for age cervical spine degeneration. Exaggerated upper thoracic kyphosis and mild levoconvex scoliosis.   Upper chest: Negative.   Other neck: Tiny hypodensities in the thyroid do not meet size criteria for follow-up (ref: J Am Coll Radiol. 2015 Feb;12(2): 143-50).The glottis is closed. Neck soft tissue contours are within normal limits. No lymphadenopathy.   Aortic arch: 3 vessel arch configuration.  No arch atherosclerosis.   Right carotid system: Negative.   Left carotid system: Negative; anatomic variation in the form of a arterial branch arising from the medial bifurcation with a small associated infundibulum (series 11, image 100). This is probably the ascending pharyngeal artery on that side.   Vertebral arteries: Negative. Fairly codominant vertebral arteries. There is some right V1 segment paravertebral venous contrast contamination.   CTA HEAD   Posterior circulation: Fairly codominant distal vertebral arteries are normal to the vertebrobasilar junction. Normal PICA origins. No stenosis. Patent vertebrobasilar junction and basilar artery without stenosis. Normal SCA and right PCA origins. Fetal type left PCA origin. Both posterior communicating arteries are present. Bilateral PCA branches are within normal limits.   Anterior circulation: Both ICA siphons are patent. Trace if any siphon plaque. No ICA siphon stenosis. Normal ophthalmic and posterior communicating artery origins. Patent carotid termini. Normal MCA and ACA origins. Dominant right ACA A1 segment. Diminutive anterior communicating artery. The right A2 and distal branches are also dominant. Left MCA M1 segment bifurcates early without stenosis. Left MCA branches are within normal limits. Right MCA M1 segment and trifurcation are patent without stenosis. Right MCA  branches are within normal limits.   Venous sinuses: Patent. Dominant right transverse and sigmoid sinuses. Normal arachnoid granulation at the junction of the left transverse and sigmoid.   Anatomic variants: Small innominate arterial branch arising from the left carotid bifurcation with an infundibulum. Fetal type left PCA origin. Dominant right ACA. Dominant right transverse and sigmoid venous sinuses.   Review of the MIP images confirms the above findings   IMPRESSION: 1. No atherosclerosis or stenosis on CTA head and neck.   2. However, there are age indeterminate small vessel infarcts in the right hemisphere white matter (posterior right corona radiata), and in the left cerebellum. The former could be the symptomatic lesion with respect to left side paresthesia. No intracranial hemorrhage or mass effect.     Electronically Signed   By: VEAR Hurst M.D.   On: 06/29/2020 13:00     ASSESSMENT AND PLAN:  1.  NSTEMI in May 2021  Likely SCAD of distal  LCx - no recurrent chest pain.  - Continue statin and Coreg  - follow up in one year  2. HTN: BP is well controlled - Continue carvedilol   3. HLD: LDL 82. Switched to atorvastatin  per PCP    Current medicines are reviewed at length with the patient today.  The patient does not have concerns regarding medicines.  The following changes have been made:  As above  Labs/ tests ordered today include:   Orders Placed This Encounter  Procedures   EKG 12-Lead     Disposition:   FU with Dr. Sw[place]and in one year  Signed, Kyiah Canepa [place], MD  08/15/2024 3:33 PM

## 2024-08-15 ENCOUNTER — Ambulatory Visit: Attending: Cardiology | Admitting: Cardiology

## 2024-08-15 ENCOUNTER — Encounter: Payer: Self-pay | Admitting: Cardiology

## 2024-08-15 VITALS — BP 129/84 | HR 65 | Ht 68.0 in | Wt 155.0 lb

## 2024-08-15 DIAGNOSIS — I2510 Atherosclerotic heart disease of native coronary artery without angina pectoris: Secondary | ICD-10-CM | POA: Insufficient documentation

## 2024-08-15 DIAGNOSIS — I10 Essential (primary) hypertension: Secondary | ICD-10-CM | POA: Diagnosis present

## 2024-08-15 NOTE — Patient Instructions (Signed)
 Medication Instructions:  Continue same medications *If you need a refill on your cardiac medications before your next appointment, please call your pharmacy*  Lab Work: None ordered  Testing/Procedures: None ordered  Follow-Up: At [facility], you and your health needs are our priority.  As part of our continuing mission to provide you with exceptional heart care, our providers are all part of one team.  This team includes your primary Cardiologist (physician) and Advanced Practice Providers or APPs (Physician Assistants and Nurse Practitioners) who all work together to provide you with the care you need, when you need it.  Your next appointment:  1 year   Call in May to schedule Sept appointment      Provider:  Dr.Jordan   We recommend signing up for the patient portal called MyChart.  Sign up information is provided on this After Visit Summary.  MyChart is used to connect with patients for Virtual Visits (Telemedicine).  Patients are able to view lab/test results, encounter notes, upcoming appointments, etc.  Non-urgent messages can be sent to your provider as well.   To learn more about what you can do with MyChart, go to [email or web address].

## 2024-12-07 ENCOUNTER — Other Ambulatory Visit: Payer: Self-pay | Admitting: Nurse Practitioner
